# Patient Record
Sex: Female | Born: 1995 | Hispanic: Yes | Marital: Single | Smoking: Never smoker
Health system: Southern US, Community
[De-identification: ages and names within clinical notes are randomized; demographics above are authoritative.]

---
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
## 2016-07-05 ENCOUNTER — Encounter: Payer: Self-pay | Admitting: Unknown Physician Specialty

## 2016-07-05 ENCOUNTER — Ambulatory Visit (INDEPENDENT_AMBULATORY_CARE_PROVIDER_SITE_OTHER): Payer: BLUE CROSS/BLUE SHIELD | Admitting: Unknown Physician Specialty

## 2016-07-05 DIAGNOSIS — J301 Allergic rhinitis due to pollen: Secondary | ICD-10-CM | POA: Diagnosis not present

## 2016-07-05 DIAGNOSIS — Z7689 Persons encountering health services in other specified circumstances: Secondary | ICD-10-CM | POA: Insufficient documentation

## 2016-07-05 DIAGNOSIS — J309 Allergic rhinitis, unspecified: Secondary | ICD-10-CM | POA: Insufficient documentation

## 2016-07-05 DIAGNOSIS — L509 Urticaria, unspecified: Secondary | ICD-10-CM

## 2016-07-05 MED ORDER — FLUTICASONE PROPIONATE 0.05 % EX CREA: TOPICAL_CREAM | Freq: Two times a day (BID) | CUTANEOUS | 0 refills | Status: DC

## 2016-07-05 MED ORDER — MOMETASONE FUROATE 0.1 % EX CREA: 1.0000 "application " | TOPICAL_CREAM | Freq: Every day | CUTANEOUS | 0 refills | Status: DC

## 2016-07-05 NOTE — Assessment & Plan Note (Signed)
Clariton on Zyrtec OTC

## 2016-07-05 NOTE — Progress Notes (Signed)
BP 102/65   Pulse 70   Temp 98.7 F (37.1 C)   Ht 5' 2.2" (1.58 m)   Wt 103 lb 12.8 oz (47.1 kg)   LMP 06/12/2016 (Approximate)   SpO2 99%   BMI 18.86 kg/m    Subjective:    Patient ID: Tammy Shaw, female    DOB: 06/09/1995, 21 y.o.   MRN: [id number]  HPI: Tammy Shaw is a 21 y.o. female  Chief Complaint  Patient presents with  . Establish Care  . Rash    pt states that she has a rash on her arms and neck that come and go. She states that it was very bad on Monday    Depression screen PHQ 2/9 07/05/2016  Decreased Interest 0  Down, Depressed, Hopeless 0  PHQ - 2 Score 0  Altered sleeping 0  Tired, decreased energy 0  Change in appetite 0  Feeling bad or failure about yourself  0  Trouble concentrating 0  Moving slowly or fidgety/restless 0  Suicidal thoughts 0  PHQ-9 Score 0   Rash Pt states she has rashes for the last 2 weeks that comes and goes on upper arms, neck and sometimes around the eye.  The rash itches and lasts for about 2 days and then approves.  Worse in the morning.  Not sure what makes it better.   She states she sneezes "all the time."  States she does have spring allergies   Relevant past medical, surgical, family and social history reviewed and updated as indicated. Interim medical history since our last visit reviewed. Allergies and medications reviewed and updated.  Review of Systems  Constitutional: Negative.   HENT: Negative.   Eyes: Negative.   Respiratory: Negative.   Cardiovascular: Negative.   Gastrointestinal: Negative.   Endocrine: Negative.   Genitourinary: Negative.   Musculoskeletal: Negative.   Skin: Negative.   Allergic/Immunologic: Negative.   Neurological: Negative.   Hematological: Negative.   Psychiatric/Behavioral: Negative.     Per HPI unless specifically indicated above     Objective:    BP 102/65   Pulse 70   Temp 98.7 F (37.1 C)   Ht 5' 2.2" (1.58 m)   Wt 103 lb 12.8 oz (47.1 kg)    LMP 06/12/2016 (Approximate)   SpO2 99%   BMI 18.86 kg/m   Wt Readings from Last 3 Encounters:  07/05/16 103 lb 12.8 oz (47.1 kg) (7 %, Z= -1.48)*   * Growth percentiles are based on CDC 2-20 Years data.    Physical Exam  Constitutional: She is oriented to person, place, and time. She appears well-developed and well-nourished. No distress.  HENT:  Head: Normocephalic and atraumatic.  Eyes: Conjunctivae and lids are normal. Right eye exhibits no discharge. Left eye exhibits no discharge. No scleral icterus.  Neck: Normal range of motion. Neck supple. No JVD present. Carotid bruit is not present.  Cardiovascular: Normal rate, regular rhythm and normal heart sounds.   Pulmonary/Chest: Effort normal and breath sounds normal.  Abdominal: Normal appearance. There is no splenomegaly or hepatomegaly.  Musculoskeletal: Normal range of motion.  Neurological: She is alert and oriented to person, place, and time.  Skin: Skin is warm, dry and intact. No pallor.  Faint rash upper right arm.  Appears to be hives.    Psychiatric: She has a normal mood and affect. Her behavior is normal. Judgment and thought content normal.    No results found for this or any previous visit.  Assessment & Plan:   Problem List Items Addressed This Visit      Unprioritized   Allergic rhinitis    Clariton on Zyrtec OTC      Encounter to establish care with new doctor   Urticaria    Clariton or Zyrtec OTC.  Elocon cream          Follow up plan: Return for physical.

## 2016-07-05 NOTE — Addendum Note (Signed)
Addended by: Gabriel CirriWICKER, Bonny Vanleeuwen on: 07/05/2016 04:49 PM   Modules accepted: Orders

## 2016-07-05 NOTE — Patient Instructions (Signed)
Clariton or Zyrtec OTC for allergies and rash

## 2016-07-05 NOTE — Assessment & Plan Note (Addendum)
Clariton or Zyrtec OTC.  Elocon cream

## 2016-07-06 ENCOUNTER — Telehealth: Payer: Self-pay

## 2016-07-06 NOTE — Telephone Encounter (Signed)
Tried calling patient to talk with her about the cream [place]. We received a fax from the pharmacy stating that the mometasone cream was not covered by Medicaid. I looked up the [facility]Medicaid Formulary and it says that mometasone and fluticasone are covered. I called the pharmacy and they stated that they have tried to run both of the medications as brand and generic and neither are going through. The fax states that there is something [facility]. [facility] so I could call Medicaid. When I pulled up the patient's insurance card before calling and the card we have on file from yesterday is BCBS. Tried calling patient to ask her to take her current insurance card to the pharmacy to see if her medication would be covered then. I called the home number and [facility]. Tried the mobile number and there was no answer and VM box was full so I was unable to leave a message. Will try to call again later.

## 2016-07-06 NOTE — Telephone Encounter (Signed)
Opened in Error.

## 2016-07-10 NOTE — Telephone Encounter (Signed)
Tried calling patient, no answer and VM box was full so I was unable to leave a message. Will try to call again later.  

## 2016-07-11 NOTE — Telephone Encounter (Signed)
Called pharmacy to see if medication had been picked up since I have been unable to get a hold of the patient. Pharmacy stated that the cream was picked up already.

## 2016-07-25 ENCOUNTER — Encounter: Payer: Self-pay | Admitting: Unknown Physician Specialty

## 2016-07-25 ENCOUNTER — Ambulatory Visit (INDEPENDENT_AMBULATORY_CARE_PROVIDER_SITE_OTHER): Payer: BLUE CROSS/BLUE SHIELD | Admitting: Unknown Physician Specialty

## 2016-07-25 VITALS — BP 100/64 | HR 87 | Temp 98.9°F | Ht 62.2 in | Wt 102.4 lb

## 2016-07-25 DIAGNOSIS — Z114 Encounter for screening for human immunodeficiency virus [HIV]: Secondary | ICD-10-CM | POA: Diagnosis not present

## 2016-07-25 DIAGNOSIS — Z0000 Encounter for general adult medical examination without abnormal findings: Secondary | ICD-10-CM

## 2016-07-25 NOTE — Patient Instructions (Addendum)
Cuidados preventivos en las mujeres de entre 52 y 44aos (Preventive Care 18-39 Years, Female) Los cuidados preventivos hacen referencia a las opciones en cuanto al estilo de vida y a las visitas al mdico, las cuales pueden promover la salud y Musician. QU INCLUYEN LOS CUIDADOS PREVENTIVOS?  Un examen fsico anual. Tambin se lo conoce como control de rutina anual.  Exmenes dentales una o dos veces al ao.  Exmenes oculares de [place]. Pregntele al mdico con qu frecuencia debe hacerse controles oculares.  Opciones personales en cuanto al estilo de vida, entre ellas: ? [place] y las piezas dentales. ? Realizar actividad fsica con regularidad. ? Consumir una dieta saludable. ? Evitar el consumo de tabaco y de drogas. ? Limitar el consumo de bebidas alcohlicas. ? Counsellor. ? Tomar los suplementos vitamnicos y [employer] se lo haya indicado el mdico.  QU SUCEDE DURANTE UN CONTROL DE [place]? Los servicios y las pruebas de deteccin que el mdico realiza durante un control de rutina anual dependern de la edad, el estado de salud general, los factores de riesgo relacionados con el estilo de vida y los antecedentes familiares de enfermedades. Psicoterapia El mdico puede hacerle preguntas sobre lo siguiente:  Consumo de alcohol.  Consumo de tabaco.  Consumo de drogas.  [place] familiar y en las relaciones.  Actividad sexual.  Hbitos de alimentacin.  Trabajo y entorno laboral.  Mtodos anticonceptivos.  El ciclo menstrual.  Antecedentes de embarazos. Pruebas de deteccin Pueden hacerle las siguientes pruebas o mediciones:  Estatura, peso e ndice de masa corporal [facility]).  Pruebas de deteccin de la diabetes. Para realizarlas, se controla el nivel de azcar en la sangre (glucemia) despus de que haya pasado un rato sin comer (ayuno).  La presin arterial.  Niveles de lpidos y  colesterol. Se pueden controlar cada 86aos, a partir de los [place].  Controles de la piel.  Anlisis de sangre de hepatitisC.  Anlisis de sangre de hepatitisB.  Pruebas de deteccin de enfermedades de transmisin sexual (ETS).  Pruebas de deteccin de tumores malignos relacionados con el BRCA. Se pueden realizar si tiene antecedentes familiares de cncer de mama, de ovarios, de trompas o de peritoneo.  Examen plvico y prueba de Papanicolaou. Se pueden realizar cada 3aos, a partir de los 21aos. A partir de los 30aos, se pueden realizar cada 5aos si le hacen una prueba de Papanicolaou en combinacin con un anlisis del virus del Engineer, technical sales (VPH). Hable con el mdico [employer], las opciones de tratamiento y, si es necesario, la necesidad de Optometrist ms [place]. Vacunas El mdico puede recomendarle que se aplique algunas vacunas, por ejemplo:  [place] antigripal. Se recomienda su aplicacin todos los aos.  Vacuna contra la difteria, el ttanos y Research officer, trade union (Tdap, Td). Puede que tenga que aplicarse un refuerzo contra el ttanos y la difteria (Td) cada 10aos.  Vacuna contra la varicela. Es posible que tenga que aplicrsela si no recibi esta vacuna.  [place] el VPH. Si es menor de 26aos, puede que necesite aplicarse tres dosis a lo largo de 59mses.  Vacuna contra el sarampin, la rubola y las paperas ([place]. Tendr que aplicarse por lo menos una dosis de la SRP. Podra tambin necesitar una segunda dosis.  Vacuna antineumoccica conjugada 13valente (PCV13). Puede necesitar esta vacuna si tiene determinadas enfermedades y no se vacun anteriormente.  Vacuna antineumoccica de polisacridos (PPSV23). Quizs tenga que aplicarse [place]  dosis si fuma o si sufre determinadas enfermedades.  Vacuna antimeningoccica. Se recomienda la aplicacin de Ardelia Mems dosis si tiene entre 19 y 21aos, y es estudiante universitaria de Psychologist, educational  ao que vive en una residencia estudiantil, o si tiene una de varias enfermedades. Podra tambin necesitar dosis de refuerzo.  Vacuna contra la hepatitis A. Es posible que tenga que aplicarse esta vacuna si tiene determinadas enfermedades, o si trabaja en lugares donde puede estar expuesta a la hepatitisA o viaja a estas zonas.  Vacuna contra la hepatitis B. Es posible que tenga que aplicarse esta vacuna si tiene determinadas enfermedades, o si trabaja en lugares donde puede estar expuesta a la hepatitisB o viaja a estas zonas.  Vacuna antihaemophilus influenzae tipoB (Hib). Tal vez deba aplicarse esta vacuna si tiene determinados factores de [place]. Hable con el [place] deteccin y las vacunas que tiene que [place], y con qu frecuencia debe hacerlo. Esta informacin no tiene Marine scientist el consejo del mdico. Asegrese de hacerle al mdico cualquier pregunta que tenga. Document Released: 10/05/2004 Document Revised: 01/16/2014 Document Reviewed: 10/27/2014 Elsevier Interactive Patient Education  2017 [employer].

## 2016-07-25 NOTE — Progress Notes (Signed)
BP 100/64   Pulse 87   Temp 98.9 F (37.2 C)   Ht 5' 2.2" (1.58 m)   Wt 102 lb 6.4 oz (46.4 kg)   LMP 07/11/2016 (Approximate)   SpO2 97%   BMI 18.61 kg/m    Subjective:    Patient ID: Tammy Shaw, female    DOB: 05/12/1995, 21 y.o.   MRN: [id number]  HPI: Tammy Shaw is a 21 y.o. female  Chief Complaint  Patient presents with  . Annual Exam    pt states she would like to be screened for HIV, order entered    Social History   Social History  . Marital status: Single    Spouse name: N/A  . Number of children: N/A  . Years of education: N/A   Occupational History  . Not on file.   Social History Main Topics  . Smoking status: Never Smoker  . Smokeless tobacco: Never Used  . Alcohol use No  . Drug use: No  . Sexual activity: Yes   Other Topics Concern  . Not on file   Social History Narrative  . No narrative on file   Family History  Problem Relation Age of Onset  . Epilepsy Paternal Grandmother    History reviewed. No pertinent past medical history.  History reviewed. No pertinent surgical history.  Relevant past medical, surgical, family and social history reviewed and updated as indicated. Interim medical history since our last visit reviewed. Allergies and medications reviewed and updated.  Review of Systems  Constitutional: Negative.   HENT: Negative.   Eyes: Negative.   Respiratory: Negative.   Cardiovascular: Negative.   Gastrointestinal: Negative.   Endocrine: Negative.   Genitourinary: Negative.   Musculoskeletal: Negative.   Skin: Negative.   Allergic/Immunologic: Negative.   Neurological: Negative.   Hematological: Negative.   Psychiatric/Behavioral: Negative.     Per HPI unless specifically indicated above     Objective:    BP 100/64   Pulse 87   Temp 98.9 F (37.2 C)   Ht 5' 2.2" (1.58 m)   Wt 102 lb 6.4 oz (46.4 kg)   LMP 07/11/2016 (Approximate)   SpO2 97%   BMI 18.61 kg/m   Wt Readings from Last 3  Encounters:  07/25/16 102 lb 6.4 oz (46.4 kg) (5 %, Z= -1.60)*  07/05/16 103 lb 12.8 oz (47.1 kg) (7 %, Z= -1.48)*   * Growth percentiles are based on CDC 2-20 Years data.    Physical Exam  Constitutional: She is oriented to person, place, and time. She appears well-developed and well-nourished.  HENT:  Head: Normocephalic and atraumatic.  Eyes: Pupils are equal, round, and reactive to light. Right eye exhibits no discharge. Left eye exhibits no discharge. No scleral icterus.  Neck: Normal range of motion. Neck supple. Carotid bruit is not present. No thyromegaly present.  Cardiovascular: Normal rate, regular rhythm and normal heart sounds.  Exam reveals no gallop and no friction rub.   No murmur heard. Pulmonary/Chest: Effort normal and breath sounds normal. No respiratory distress. She has no wheezes. She has no rales.  Abdominal: Soft. Bowel sounds are normal. There is no tenderness. There is no rebound.  Genitourinary: No breast swelling, tenderness or discharge.  Musculoskeletal: Normal range of motion.  Lymphadenopathy:    She has no cervical adenopathy.  Neurological: She is alert and oriented to person, place, and time.  Skin: Skin is warm, dry and intact. No rash noted.  Psychiatric: She has a normal  mood and affect. Her speech is normal and behavior is normal. Judgment and thought content normal. Cognition and memory are normal.    No results found for this or any previous visit.    Assessment & Plan:   Problem List Items Addressed This Visit    None    Visit Diagnoses    Screening for HIV without presence of risk factors    -  Primary   Relevant Orders   HIV antibody   Routine general medical examination at a health care facility       Relevant Orders   GC/Chlamydia Probe Amp   CBC with Differential/Platelet   Comprehensive metabolic panel   TSH       Follow up plan: Return in about 1 year (around 07/25/2017).

## 2016-07-26 LAB — COMPREHENSIVE METABOLIC PANEL
A/G RATIO: 2 (ref 1.2–2.2)
ALBUMIN: 4.9 g/dL (ref 3.5–5.5)
ALT: 11 IU/L (ref 0–32)
AST: 16 IU/L (ref 0–40)
Alkaline Phosphatase: 67 IU/L (ref 39–117)
BUN/Creatinine Ratio: 25 — ABNORMAL HIGH (ref 9–23)
BUN: 15 mg/dL (ref 6–20)
Bilirubin Total: 0.4 mg/dL (ref 0.0–1.2)
CALCIUM: 10 mg/dL (ref 8.7–10.2)
CO2: 22 mmol/L (ref 20–29)
CREATININE: 0.6 mg/dL (ref 0.57–1.00)
Chloride: 103 mmol/L (ref 96–106)
GFR, EST AFRICAN AMERICAN: 153 mL/min/{1.73_m2} (ref >59)
GFR, EST NON AFRICAN AMERICAN: 133 mL/min/{1.73_m2} (ref >59)
GLOBULIN, TOTAL: 2.5 g/dL (ref 1.5–4.5)
Glucose: 80 mg/dL (ref 65–99)
POTASSIUM: 4.2 mmol/L (ref 3.5–5.2)
SODIUM: 142 mmol/L (ref 134–144)
TOTAL PROTEIN: 7.4 g/dL (ref 6.0–8.5)

## 2016-07-26 LAB — CBC WITH DIFFERENTIAL/PLATELET
Basophils Absolute: 0 10*3/uL (ref 0.0–0.2)
Basos: 1 %
EOS (ABSOLUTE): 0.1 10*3/uL (ref 0.0–0.4)
EOS: 2 %
HEMATOCRIT: 42 % (ref 34.0–46.6)
HEMOGLOBIN: 13.4 g/dL (ref 11.1–15.9)
IMMATURE GRANS (ABS): 0 10*3/uL (ref 0.0–0.1)
IMMATURE GRANULOCYTES: 0 %
Lymphocytes Absolute: 1.3 10*3/uL (ref 0.7–3.1)
Lymphs: 24 %
MCH: 27.9 pg (ref 26.6–33.0)
MCHC: 31.9 g/dL (ref 31.5–35.7)
MCV: 87 fL (ref 79–97)
MONOCYTES: 6 %
Monocytes Absolute: 0.3 10*3/uL (ref 0.1–0.9)
NEUTROS PCT: 67 %
Neutrophils Absolute: 3.8 10*3/uL (ref 1.4–7.0)
Platelets: 261 10*3/uL (ref 150–379)
RBC: 4.81 x10E6/uL (ref 3.77–5.28)
RDW: 13.5 % (ref 12.3–15.4)
WBC: 5.5 10*3/uL (ref 3.4–10.8)

## 2016-07-26 LAB — TSH: TSH: 0.254 u[IU]/mL — ABNORMAL LOW (ref 0.450–4.500)

## 2016-07-26 LAB — HIV ANTIBODY (ROUTINE TESTING W REFLEX): HIV Screen 4th Generation wRfx: NONREACTIVE

## 2016-07-28 ENCOUNTER — Other Ambulatory Visit: Payer: Self-pay | Admitting: Unknown Physician Specialty

## 2016-07-28 ENCOUNTER — Encounter: Payer: Self-pay | Admitting: Unknown Physician Specialty

## 2016-07-28 DIAGNOSIS — R7989 Other specified abnormal findings of blood chemistry: Secondary | ICD-10-CM

## 2016-07-28 LAB — GC/CHLAMYDIA PROBE AMP
CHLAMYDIA, DNA PROBE: NEGATIVE
Neisseria gonorrhoeae by PCR: NEGATIVE

## 2016-08-01 ENCOUNTER — Encounter: Payer: Self-pay | Admitting: Unknown Physician Specialty

## 2016-12-27 ENCOUNTER — Other Ambulatory Visit: Payer: BLUE CROSS/BLUE SHIELD

## 2016-12-27 DIAGNOSIS — R7989 Other specified abnormal findings of blood chemistry: Secondary | ICD-10-CM

## 2016-12-28 LAB — THYROID PANEL WITH TSH
FREE THYROXINE INDEX: 1.7 (ref 1.2–4.9)
T3 Uptake Ratio: 26 % (ref 24–39)
T4 TOTAL: 6.7 ug/dL (ref 4.5–12.0)
TSH: 2.36 u[IU]/mL (ref 0.450–4.500)

## 2016-12-29 ENCOUNTER — Encounter: Payer: Self-pay | Admitting: Unknown Physician Specialty

## 2017-07-25 LAB — CHLAMYDIA SCREEN

## 2017-08-06 ENCOUNTER — Ambulatory Visit (INDEPENDENT_AMBULATORY_CARE_PROVIDER_SITE_OTHER): Payer: 59 | Admitting: Physician Assistant

## 2017-08-06 ENCOUNTER — Encounter: Payer: Self-pay | Admitting: Physician Assistant

## 2017-08-06 VITALS — BP 84/52 | HR 73 | Temp 98.5°F | Ht 62.38 in | Wt 105.0 lb

## 2017-08-06 DIAGNOSIS — Z0000 Encounter for general adult medical examination without abnormal findings: Secondary | ICD-10-CM

## 2017-08-06 DIAGNOSIS — Z0001 Encounter for general adult medical examination with abnormal findings: Secondary | ICD-10-CM

## 2017-08-06 DIAGNOSIS — N921 Excessive and frequent menstruation with irregular cycle: Secondary | ICD-10-CM | POA: Diagnosis not present

## 2017-08-06 MED ORDER — NORETHINDRONE ACET-ETHINYL EST 1.5-30 MG-MCG PO TABS: 1.0000 | ORAL_TABLET | Freq: Every day | ORAL | 3 refills | Status: DC

## 2017-08-06 NOTE — Progress Notes (Signed)
Subjective:    Patient ID: Tammy Shaw, female    DOB: 06/19/1995, 22 y.o.   MRN: [id number]  Tammy Shaw is a 22 y.o. 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 Relationship - no Sexually active - not currently Sexually active ever - yes LMP - first day was 07/25/2017 Reports heavy menstrual cycles with some irregular bleeding, though the irregular bleeding can be intermittent. Does not smoke, does not have migraines, has never had blood clot.    History reviewed. No pertinent past medical history. History reviewed. No pertinent surgical history. Social History   Socioeconomic History  . Marital status: Single    Spouse name: Not on file  . Number of children: Not on file  . Years of education: Not on file  . Highest education level: Not on file  Occupational History  . Not on file  Social Needs  . Financial resource strain: Not on file  . Food insecurity:    Worry: Not on file    Inability: Not on file  . Transportation needs:    Medical: Not on file    Non-medical: Not on file  Tobacco Use  . Smoking status: Never Smoker  . Smokeless tobacco: Never Used  Substance and Sexual Activity  . Alcohol use: No  . Drug use: No  . Sexual activity: Yes  Lifestyle  . Physical activity:    Days per week: Not on file    Minutes per session: Not on file  . Stress: Not on file  Relationships  . Social connections:    Talks on phone: Not on file    Gets together: Not on file    Attends religious service: Not on file    Active member of club or organization: Not on file    Attends meetings of clubs or organizations: Not on file    Relationship status: Not on file  . Intimate partner violence:    Fear of current or ex partner: Not on file    Emotionally abused: Not on file    Physically abused: Not on file    Forced sexual activity:  Not on file  Other Topics Concern  . Not on file  Social History Narrative  . Not on file   Family History  Problem Relation Age of Onset  . Epilepsy Paternal Grandmother    No current outpatient medications on file prior to visit.   No current facility-administered medications on file prior to visit.     Review of Systems Per HPI unless specifically indicated above     Objective:    BP (!) 84/52 (BP Location: Left Arm, Patient Position: Sitting, Cuff Size: Normal)   Pulse 73   Temp 98.5 F (36.9 C) (Tympanic)   Ht 5' 2.38" (1.584 m)   Wt 105 lb (47.6 kg)   SpO2 100%   BMI 18.97 kg/m   Wt Readings from Last 3 Encounters:  08/06/17 105 lb (47.6 kg)  07/25/16 102 lb 6.4 oz (46.4 kg) (5 %, Z= -1.60)*  07/05/16 103 lb 12.8 oz (47.1 kg) (7 %, Z= -1.48)*   * Growth percentiles are based on CDC (Girls, 2-20 Years) data.    Physical Exam  Constitutional: She is oriented to person, place, and time. She appears well-developed and well-nourished.  Cardiovascular: Normal rate and regular rhythm.  Pulmonary/Chest: Effort normal and breath sounds  normal.  Neurological: She is alert and oriented to person, place, and time.  Skin: Skin is warm and dry.  Psychiatric: She has a normal mood and affect. Her behavior is normal.   Results for orders placed or performed in visit on 08/06/17  Chlamydia screen  Result Value Ref Range   Chlamydia Probe Amp        Assessment & Plan:   1. Annual physical exam  UTD on vaccinations and STI screening. She will be due for PAP when she is 22. Can start OCP as below for menstrual cycle regulation, counseled on start methods, contraceptive use, and how it does not protect against STI. She can follow up after she turns 22 for PAP and to assess effectiveness of OCP.  2. Menorrhagia with irregular cycle  - Norethindrone Acetate-Ethinyl Estradiol (JUNEL 1.5/30) 1.5-30 MG-MCG tablet; Take 1 tablet by mouth daily.  Dispense: 3 Package; Refill:  3    Follow up plan: Return in about 5 months (around 01/06/2018) for PAP, follow up irregular cycle .  Osvaldo Angst, PA-C [facility] 08/07/2017, 11:10 AM

## 2017-08-06 NOTE — Patient Instructions (Signed)
Menorrhagia Menorrhagia is when your menstrual periods are heavy or last longer than usual. Follow these instructions at home:  Only take medicine as told by your doctor.  Take any iron pills as told by your doctor. Heavy bleeding may cause low levels of iron in your body.  Do not take aspirin 1 week before or during your period. Aspirin can make the bleeding worse.  Lie down for a while if you change your tampon or pad more than once in 2 hours. This may help lessen the bleeding.  Eat a healthy diet and foods with iron. These foods include leafy green vegetables, meat, liver, eggs, and whole grain breads and cereals.  Do not try to lose weight. Wait until the heavy bleeding has stopped and your iron level is normal. Contact a doctor if:  You soak through a pad or tampon every 1 or 2 hours, and this happens every time you have a period.  You need to use pads and tampons at the same time because you are bleeding so much.  You need to change your pad or tampon during the night.  You have a period that lasts for more than 8 days.  You pass clots bigger than 1 inch (2.5 cm) wide.  You have irregular periods that happen more or less often than once a month.  You feel dizzy or pass out (faint).  You feel very weak or tired.  You feel short of breath or feel your heart is beating too fast when you exercise.  You feel sick to your stomach (nausea) and you throw up (vomit) while you are taking your medicine.  You have watery poop (diarrhea) while you are taking your medicine.  You have any problems that may be related to the medicine you are taking. Get help right away if:  You soak through 4 or more pads or tampons in 2 hours.  You have any bleeding while you are pregnant. This information is not intended to replace advice given to you by your health care provider. Make sure you discuss any questions you have with your health care provider. Document Released: 10/05/2007 Document  Revised: 06/03/2015 Document Reviewed: 06/27/2012 Elsevier Interactive Patient Education  2017 Elsevier Inc.  

## 2018-01-07 ENCOUNTER — Ambulatory Visit: Payer: 59 | Admitting: Family Medicine

## 2018-06-05 ENCOUNTER — Ambulatory Visit: Payer: Self-pay | Admitting: Unknown Physician Specialty

## 2018-06-05 NOTE — Telephone Encounter (Addendum)
Pt. Reports she was exposed to someone who tested positive for COVID 19. No symptoms.Warm transfer to [facility] in the practice. Answer Assessment - Initial Assessment Questions 1. CLOSE CONTACT: "Who is the person with the confirmed or suspected COVID-19 infection that you were exposed to?"     Tested positive positive 2. PLACE of CONTACT: "Where were you when you were exposed to COVID-19?" (e.g., home, school, medical waiting room; which city?)     Home 3. TYPE of CONTACT: "How much contact was there?" (e.g., sitting next to, live in same house, work in same office, same building)     Several hours 4. DURATION of CONTACT: "How long were you in contact with the COVID-19 patient?" (e.g., a few seconds, passed by person, a few minutes, live with the patient)     Over night 5. DATE of CONTACT: "When did you have contact with a COVID-19 patient?" (e.g., how many days ago)     10 days 6. TRAVEL: "Have you traveled out of the country recently?" If so, "When and where?"     * Also ask about out-of-state travel, since the CDC has identified some high risk cities for community spread in the [place].     * Note: Travel becomes less relevant if there is widespread community transmission where the patient lives.     No 7. COMMUNITY SPREAD: "Are there lots of cases of COVID-19 (community spread) where you live?" (See public health department website, if unsure)   * MAJOR community spread: high number of cases; numbers of cases are increasing; many people hospitalized.   * MINOR community spread: low number of cases; not increasing; few or no people hospitalized     Yes 8. SYMPTOMS: "Do you have any symptoms?" (e.g., fever, cough, breathing difficulty)     No 9. PREGNANCY OR POSTPARTUM: "Is there any chance you are pregnant?" "When was your last menstrual period?" "Did you deliver in the last 2 weeks?"     No 10. HIGH RISK: "Do you have any heart or lung problems? Do you have a weak immune system?" (e.g., CHF,  COPD, asthma, HIV positive, chemotherapy, renal failure, diabetes mellitus, sickle cell anemia)       No  Protocols used: CORONAVIRUS (COVID-19) EXPOSURE-A-AH

## 2018-06-06 ENCOUNTER — Other Ambulatory Visit: Payer: Self-pay

## 2018-06-06 ENCOUNTER — Encounter: Payer: Self-pay | Admitting: Family Medicine

## 2018-06-06 ENCOUNTER — Ambulatory Visit (INDEPENDENT_AMBULATORY_CARE_PROVIDER_SITE_OTHER): Payer: 59 | Admitting: Family Medicine

## 2018-06-06 DIAGNOSIS — Z20828 Contact with and (suspected) exposure to other viral communicable diseases: Secondary | ICD-10-CM

## 2018-06-06 DIAGNOSIS — Z20822 Contact with and (suspected) exposure to covid-19: Secondary | ICD-10-CM

## 2018-06-06 NOTE — Telephone Encounter (Signed)
This patient has her preferred language as Spanish- do we know how I can get an interpreter? It may be better to transfer this person to the one in the office so they can use the translator on a stick through the video call

## 2018-06-06 NOTE — Telephone Encounter (Signed)
Called pt no answer, unable to lvm

## 2018-06-06 NOTE — Progress Notes (Signed)
There were no vitals taken for this visit.   Subjective:    Patient ID: Tammy Shaw, female    DOB: 08/24/1995, 22 y.o.   MRN: [id number]  HPI: Tammy Shaw is a 23 y.o. female  Chief Complaint  Patient presents with  . covid exposure    Patient was exposed to someone that tested positive last week, she was with them 05/19/18   [name] presents today for exposure to COVID. She went to a birthday party on 05/19/18. She notes that she is feeling fine. She has no fevers. No chills. No SOB. No diarrhea. She has not been around this person for the past 18 days. She is otherwise feeling well with no other concerns or complaints at this time.   Relevant past medical, surgical, family and social history reviewed and updated as indicated. Interim medical history since our last visit reviewed. Allergies and medications reviewed and updated.  Review of Systems  Constitutional: Negative.   Respiratory: Negative.   Cardiovascular: Negative.   Gastrointestinal: Negative.   Neurological: Negative.   Psychiatric/Behavioral: Negative.     Per HPI unless specifically indicated above     Objective:    There were no vitals taken for this visit.  Wt Readings from Last 3 Encounters:  08/06/17 105 lb (47.6 kg)  07/25/16 102 lb 6.4 oz (46.4 kg) (5 %, Z= -1.60)*  07/05/16 103 lb 12.8 oz (47.1 kg) (7 %, Z= -1.48)*   * Growth percentiles are based on CDC (Girls, 2-20 Years) data.    Physical Exam Vitals signs and nursing note reviewed.  Constitutional:      General: She is not in acute distress.    Appearance: Normal appearance. She is not ill-appearing, toxic-appearing or diaphoretic.  HENT:     Head: Normocephalic and atraumatic.     Right Ear: External ear normal.     Left Ear: External ear normal.     Nose: Nose normal.     Mouth/Throat:     Mouth: Mucous membranes are moist.     Pharynx: Oropharynx is clear.  Eyes:     General: No scleral icterus.       Right eye:  No discharge.        Left eye: No discharge.     Conjunctiva/sclera: Conjunctivae normal.     Pupils: Pupils are equal, round, and reactive to light.  Neck:     Musculoskeletal: Normal range of motion.  Pulmonary:     Effort: Pulmonary effort is normal. No respiratory distress.     Comments: Speaking in full sentences Musculoskeletal: Normal range of motion.  Skin:    Coloration: Skin is not jaundiced or pale.     Findings: No bruising, erythema, lesion or rash.  Neurological:     Mental Status: She is alert and oriented to person, place, and time. Mental status is at baseline.  Psychiatric:        Mood and Affect: Mood normal.        Behavior: Behavior normal.        Thought Content: Thought content normal.        Judgment: Judgment normal.     Results for orders placed or performed in visit on 08/06/17  Chlamydia screen  Result Value Ref Range   Chlamydia Probe Amp        Assessment & Plan:   Problem List Items Addressed This Visit    None    Visit Diagnoses    Close Exposure  to Covid-19 Virus    -  Primary   Exposure 18 days ago. Entirely asymptomatic. No more need to self-quarantine. No need for monitoring. Call with any concerns.        Follow up plan: Return if symptoms worsen or fail to improve.    . This visit was completed via FaceTime due to the restrictions of the COVID-19 pandemic. All issues as above were discussed and addressed. Physical exam was done as above through visual confirmation on FaceTIme. If it was felt that the patient should be evaluated in the office, they were directed there. The patient verbally consented to this visit. . Location of the patient: home . Location of the provider: home . Those involved with this call:  . Provider: Olevia PerchesMegan , DO . CMA: Tiffany Reel, CMA . Front Desk/Registration: Adela Portshristan Williamson  . Time spent on call: 15 minutes with patient face to face via video conference. More than 50% of this time was spent  in counseling and coordination of care. 23 minutes total spent in review of patient's record and preparation of their chart.

## 2018-08-15 ENCOUNTER — Encounter: Payer: Self-pay | Admitting: Family Medicine

## 2018-08-15 ENCOUNTER — Ambulatory Visit (INDEPENDENT_AMBULATORY_CARE_PROVIDER_SITE_OTHER): Payer: 59 | Admitting: Family Medicine

## 2018-08-15 ENCOUNTER — Other Ambulatory Visit: Payer: Self-pay

## 2018-08-15 VITALS — BP 110/68 | HR 68 | Temp 98.4°F | Ht 62.0 in | Wt 114.0 lb

## 2018-08-15 DIAGNOSIS — Z0000 Encounter for general adult medical examination without abnormal findings: Secondary | ICD-10-CM | POA: Diagnosis not present

## 2018-08-15 NOTE — Progress Notes (Signed)
BP 110/68   Pulse 68   Temp 98.4 F (36.9 C) (Oral)   Ht 5\' 2"  (1.575 m)   Wt 114 lb (51.7 kg)   SpO2 97%   BMI 20.85 kg/m    Subjective:    Patient ID: Tammy Shaw, female    DOB: 04/16/1995, 23 y.o.   MRN: [id number]  HPI: Tammy Shaw is a 23 y.o. female presenting on 08/15/2018 for comprehensive medical examination. Current medical complaints include:none  Menopausal Symptoms: no  Depression Screen done today and results listed below:  [facility] 08/15/2018 08/07/2017 07/05/2016  Decreased Interest 0 0 0  Down, Depressed, Hopeless 0 0 0  PHQ - 2 Score 0 0 0  Altered sleeping 0 0 0  Tired, decreased energy 0 0 0  Change in appetite 0 0 0  Feeling bad or failure about yourself  0 0 0  Trouble concentrating 0 0 0  Moving slowly or fidgety/restless 0 0 0  Suicidal thoughts 0 0 0  PHQ-9 Score 0 0 0  Difficult doing work/chores Not difficult at all - -    Past Medical History:  History reviewed. No pertinent past medical history.  Surgical History:  History reviewed. No pertinent surgical history.  Medications:  Current Outpatient Medications on File Prior to Visit  Medication Sig  . Norethindrone Acetate-Ethinyl Estradiol (JUNEL 1.5/30) 1.5-30 MG-MCG tablet Take 1 tablet by mouth daily.   No current facility-administered medications on file prior to visit.     Allergies:  No Known Allergies  Social History:  Social History   Socioeconomic History  . Marital status: Single    Spouse name: Not on file  . Number of children: Not on file  . Years of education: Not on file  . Highest education level: Not on file  Occupational History  . Not on file  Social Needs  . Financial resource strain: Not on file  . Food insecurity    Worry: Not on file    Inability: Not on file  . Transportation needs    Medical: Not on file    Non-medical: Not on file  Tobacco Use  . Smoking status: Never Smoker  . Smokeless tobacco: Never Used   Substance and Sexual Activity  . Alcohol use: No  . Drug use: No  . Sexual activity: Yes  Lifestyle  . Physical activity    Days per week: Not on file    Minutes per session: Not on file  . Stress: Not on file  Relationships  . Social Musicianconnections    Talks on phone: Not on file    Gets together: Not on file    Attends religious service: Not on file    Active member of club or organization: Not on file    Attends meetings of clubs or organizations: Not on file    Relationship status: Not on file  . Intimate partner violence    Fear of current or ex partner: Not on file    Emotionally abused: Not on file    Physically abused: Not on file    Forced sexual activity: Not on file  Other Topics Concern  . Not on file  Social History Narrative  . Not on file   Social History   Tobacco Use  Smoking Status Never Smoker  Smokeless Tobacco Never Used   Social History   Substance and Sexual Activity  Alcohol Use No    Family History:  Family History  Problem  Relation Age of Onset  . Epilepsy Paternal Grandmother     Past medical history, surgical history, medications, allergies, family history and social history reviewed with patient today and changes made to appropriate areas of the chart.   Review of Systems  Constitutional: Negative.   HENT: Negative.   Eyes: Negative.   Respiratory: Negative.   Cardiovascular: Negative.   Gastrointestinal: Negative.   Genitourinary: Negative.   Musculoskeletal: Negative.   Skin: Negative.   Endo/Heme/Allergies: Negative.     All other ROS negative except what is listed above and in the HPI.      Objective:    BP 110/68   Pulse 68   Temp 98.4 F (36.9 C) (Oral)   Ht 5\' 2"  (1.575 m)   Wt 114 lb (51.7 kg)   SpO2 97%   BMI 20.85 kg/m   Wt Readings from Last 3 Encounters:  08/15/18 114 lb (51.7 kg)  08/06/17 105 lb (47.6 kg)  07/25/16 102 lb 6.4 oz (46.4 kg) (5 %, Z= -1.60)*   * Growth percentiles are based on CDC  (Girls, 2-20 Years) data.    Physical Exam Vitals signs and nursing note reviewed.  Constitutional:      General: She is not in acute distress.    Appearance: Normal appearance. She is not ill-appearing, toxic-appearing or diaphoretic.  HENT:     Head: Normocephalic and atraumatic.     Right Ear: Tympanic membrane, ear canal and external ear normal. There is no impacted cerumen.     Left Ear: Tympanic membrane, ear canal and external ear normal. There is no impacted cerumen.     Nose: Nose normal. No congestion or rhinorrhea.     Mouth/Throat:     Mouth: Mucous membranes are moist.     Pharynx: Oropharynx is clear. No oropharyngeal exudate or posterior oropharyngeal erythema.  Eyes:     General: No scleral icterus.       Right eye: No discharge.        Left eye: No discharge.     Extraocular Movements: Extraocular movements intact.     Conjunctiva/sclera: Conjunctivae normal.     Pupils: Pupils are equal, round, and reactive to light.  Neck:     Musculoskeletal: Normal range of motion and neck supple. No neck rigidity or muscular tenderness.     Vascular: No carotid bruit.  Cardiovascular:     Rate and Rhythm: Normal rate and regular rhythm.     Pulses: Normal pulses.     Heart sounds: No murmur. No friction rub. No gallop.   Pulmonary:     Effort: Pulmonary effort is normal. No respiratory distress.     Breath sounds: Normal breath sounds. No stridor. No wheezing, rhonchi or rales.  Chest:     Chest wall: No tenderness.  Abdominal:     General: Abdomen is flat. Bowel sounds are normal. There is no distension.     Palpations: Abdomen is soft. There is no mass.     Tenderness: There is no abdominal tenderness. There is no right CVA tenderness, left CVA tenderness, guarding or rebound.     Hernia: No hernia is present.  Genitourinary:    Comments: Breast and pelvic exams deferred with shared decision making Musculoskeletal:        General: No swelling, tenderness, deformity or  signs of injury.     Right lower leg: No edema.     Left lower leg: No edema.  Lymphadenopathy:     Cervical: No  cervical adenopathy.  Skin:    General: Skin is warm and dry.     Capillary Refill: Capillary refill takes less than 2 seconds.     Coloration: Skin is not jaundiced or pale.     Findings: No bruising, erythema, lesion or rash.  Neurological:     General: No focal deficit present.     Mental Status: She is alert and oriented to person, place, and time. Mental status is at baseline.     Cranial Nerves: No cranial nerve deficit.     Sensory: No sensory deficit.     Motor: No weakness.     Coordination: Coordination normal.     Gait: Gait normal.     Deep Tendon Reflexes: Reflexes normal.  Psychiatric:        Mood and Affect: Mood normal.        Behavior: Behavior normal.        Thought Content: Thought content normal.        Judgment: Judgment normal.     Results for orders placed or performed in visit on 08/15/18  Microscopic Examination   URINE  Result Value Ref Range   WBC, UA 0-5 0 - 5 /hpf   RBC None seen 0 - 2 /hpf   Epithelial Cells (non renal) 0-10 0 - 10 /hpf   Bacteria, UA Few (A) None seen/Few  Urine Culture, Reflex   URINE  Result Value Ref Range   Urine Culture, Routine WILL FOLLOW   UA/M w/rflx Culture, Routine   Specimen: Urine   URINE  Result Value Ref Range   Specific Gravity, UA 1.020 1.005 - 1.030   pH, UA 8.5 (H) 5.0 - 7.5   Color, UA Yellow Yellow   Appearance Ur Clear Clear   Leukocytes,UA Trace (A) Negative   Protein,UA Negative Negative/Trace   Glucose, UA Negative Negative   Ketones, UA Negative Negative   RBC, UA Negative Negative   Bilirubin, UA Negative Negative   Urobilinogen, Ur 0.2 0.2 - 1.0 mg/dL   Nitrite, UA Negative Negative   Microscopic Examination See below:    Urinalysis Reflex Comment       Assessment & Plan:   Problem List Items Addressed This Visit    None    Visit Diagnoses    Routine general medical  examination at a health care facility    -  Primary   Vaccines up to date. Screening labs checked today. Declines pap- will have her return in 6 months for well woman. Call with any concerns.    Relevant Orders   CBC with Differential/Platelet   Comprehensive metabolic panel   Lipid Panel w/o Chol/HDL Ratio   TSH   UA/M w/rflx Culture, Routine (Completed)   GC/Chlamydia Probe Amp   HIV antibody (with reflex)       Follow up plan: Return in about 6 months (around 02/15/2019) for Well woman.   LABORATORY TESTING:  - Pap smear: refused  IMMUNIZATIONS:   - Tdap: Tetanus vaccination status reviewed: last tetanus booster within 10 years. - Influenza: Postponed to flu season - HPV: Up to date  PATIENT COUNSELING:   Advised to take 1 mg of folate supplement per day if capable of pregnancy.   Sexuality: Discussed sexually transmitted diseases, partner selection, use of condoms, avoidance of unintended pregnancy  and contraceptive alternatives.   Advised to avoid cigarette smoking.  I discussed with the patient that most people either abstain from alcohol or drink within safe limits (<=14/week and <=  4 drinks/occasion for males, <=7/weeks and <= 3 drinks/occasion for females) and that the risk for alcohol disorders and other health effects rises proportionally with the number of drinks per week and how often a drinker exceeds daily limits.  Discussed cessation/primary prevention of drug use and availability of treatment for abuse.   Diet: Encouraged to adjust caloric intake to maintain  or achieve ideal body weight, to reduce intake of dietary saturated fat and total fat, to limit sodium intake by avoiding high sodium foods and not adding table salt, and to maintain adequate dietary potassium and calcium preferably from fresh fruits, vegetables, and low-fat dairy products.    stressed the importance of regular exercise  Injury prevention: Discussed safety belts, safety helmets, smoke  detector, smoking near bedding or upholstery.   Dental health: Discussed importance of regular tooth brushing, flossing, and dental visits.    NEXT PREVENTATIVE PHYSICAL DUE IN 1 YEAR. Return in about 6 months (around 02/15/2019) for Well woman.

## 2018-08-15 NOTE — Patient Instructions (Signed)
Health Maintenance, Female Adopting a healthy lifestyle and getting preventive care are important in promoting health and wellness. Ask your health care provider about:  The right schedule for you to have regular tests and exams.  Things you can do on your own to prevent diseases and keep yourself healthy. What should I know about diet, weight, and exercise? Eat a healthy diet   Eat a diet that includes plenty of vegetables, fruits, low-fat dairy products, and lean protein.  Do not eat a lot of foods that are high in solid fats, added sugars, or sodium. Maintain a healthy weight Body mass index (BMI) is used to identify weight problems. It estimates body fat based on height and weight. Your health care provider can help determine your BMI and help you achieve or maintain a healthy weight. Get regular exercise Get regular exercise. This is one of the most important things you can do for your health. Most adults should:  Exercise for at least 150 minutes each week. The exercise should increase your heart rate and make you sweat (moderate-intensity exercise).  Do strengthening exercises at least twice a week. This is in addition to the moderate-intensity exercise.  Spend less time sitting. Even light physical activity can be beneficial. Watch cholesterol and blood lipids Have your blood tested for lipids and cholesterol at 22 years of age, then have this test every 5 years. Have your cholesterol levels checked more often if:  Your lipid or cholesterol levels are high.  You are older than 22 years of age.  You are at high risk for heart disease. What should I know about cancer screening? Depending on your health history and family history, you may need to have cancer screening at various ages. This may include screening for:  Breast cancer.  Cervical cancer.  Colorectal cancer.  Skin cancer.  Lung cancer. What should I know about heart disease, diabetes, and high blood  pressure? Blood pressure and heart disease  High blood pressure causes heart disease and increases the risk of stroke. This is more likely to develop in people who have high blood pressure readings, are of African descent, or are overweight.  Have your blood pressure checked: ? Every 3-5 years if you are 18-39 years of age. ? Every year if you are 40 years old or older. Diabetes Have regular diabetes screenings. This checks your fasting blood sugar level. Have the screening done:  Once every three years after age 40 if you are at a normal weight and have a low risk for diabetes.  More often and at a younger age if you are overweight or have a high risk for diabetes. What should I know about preventing infection? Hepatitis B If you have a higher risk for hepatitis B, you should be screened for this virus. Talk with your health care provider to find out if you are at risk for hepatitis B infection. Hepatitis C Testing is recommended for:  Everyone born from 1945 through 1965.  Anyone with known risk factors for hepatitis C. Sexually transmitted infections (STIs)  Get screened for STIs, including gonorrhea and chlamydia, if: ? You are sexually active and are younger than 22 years of age. ? You are older than 22 years of age and your health care provider tells you that you are at risk for this type of infection. ? Your sexual activity has changed since you were last screened, and you are at increased risk for chlamydia or gonorrhea. Ask your health care provider if   you are at risk.  Ask your health care provider about whether you are at high risk for HIV. Your health care provider may recommend a prescription medicine to help prevent HIV infection. If you choose to take medicine to prevent HIV, you should first get tested for HIV. You should then be tested every 3 months for as long as you are taking the medicine. Pregnancy  If you are about to stop having your period (premenopausal) and  you may become pregnant, seek counseling before you get pregnant.  Take 400 to 800 micrograms (mcg) of folic acid every day if you become pregnant.  Ask for birth control (contraception) if you want to prevent pregnancy. Osteoporosis and menopause Osteoporosis is a disease in which the bones lose minerals and strength with aging. This can result in bone fractures. If you are 65 years old or older, or if you are at risk for osteoporosis and fractures, ask your health care provider if you should:  Be screened for bone loss.  Take a calcium or vitamin D supplement to lower your risk of fractures.  Be given hormone replacement therapy (HRT) to treat symptoms of menopause. Follow these instructions at home: Lifestyle  Do not use any products that contain nicotine or tobacco, such as cigarettes, e-cigarettes, and chewing tobacco. If you need help quitting, ask your health care provider.  Do not use street drugs.  Do not share needles.  Ask your health care provider for help if you need support or information about quitting drugs. Alcohol use  Do not drink alcohol if: ? Your health care provider tells you not to drink. ? You are pregnant, may be pregnant, or are planning to become pregnant.  If you drink alcohol: ? Limit how much you use to 0-1 drink a day. ? Limit intake if you are breastfeeding.  Be aware of how much alcohol is in your drink. In the [place] one drink equals one 12 oz bottle of beer (355 mL), one 5 oz glass of wine (148 mL), or one 1 oz glass of hard liquor (44 mL). General instructions  Schedule regular health, dental, and eye exams.  Stay current with your vaccines.  Tell your health care provider if: ? You often feel depressed. ? You have ever been abused or do not feel safe at home. Summary  Adopting a healthy lifestyle and getting preventive care are important in promoting health and wellness.  Follow your health care provider's instructions about healthy  diet, exercising, and getting tested or screened for diseases.  Follow your health care provider's instructions on monitoring your cholesterol and blood pressure. This information is not intended to replace advice given to you by your health care provider. Make sure you discuss any questions you have with your health care provider. Document Released: 07/11/2010 Document Revised: 12/19/2017 Document Reviewed: 12/19/2017 Elsevier Patient Education  2020 [employer]  

## 2018-08-16 LAB — COMPREHENSIVE METABOLIC PANEL
ALT: 9 IU/L (ref 0–32)
AST: 18 IU/L (ref 0–40)
Albumin/Globulin Ratio: 1.8 (ref 1.2–2.2)
Albumin: 4.8 g/dL (ref 3.9–5.0)
Alkaline Phosphatase: 55 IU/L (ref 39–117)
BUN/Creatinine Ratio: 15 (ref 9–23)
BUN: 10 mg/dL (ref 6–20)
Bilirubin Total: 0.3 mg/dL (ref 0.0–1.2)
CO2: 23 mmol/L (ref 20–29)
Calcium: 9.8 mg/dL (ref 8.7–10.2)
Chloride: 101 mmol/L (ref 96–106)
Creatinine, Ser: 0.68 mg/dL (ref 0.57–1.00)
GFR calc Af Amer: 145 mL/min/{1.73_m2} (ref >59)
GFR calc non Af Amer: 125 mL/min/{1.73_m2} (ref >59)
Globulin, Total: 2.6 g/dL (ref 1.5–4.5)
Glucose: 86 mg/dL (ref 65–99)
Potassium: 4.2 mmol/L (ref 3.5–5.2)
Sodium: 139 mmol/L (ref 134–144)
Total Protein: 7.4 g/dL (ref 6.0–8.5)

## 2018-08-16 LAB — CBC WITH DIFFERENTIAL/PLATELET
Basophils Absolute: 0.1 10*3/uL (ref 0.0–0.2)
Basos: 1 %
EOS (ABSOLUTE): 0.2 10*3/uL (ref 0.0–0.4)
Eos: 4 %
Hematocrit: 40.1 % (ref 34.0–46.6)
Hemoglobin: 13.4 g/dL (ref 11.1–15.9)
Immature Grans (Abs): 0 10*3/uL (ref 0.0–0.1)
Immature Granulocytes: 0 %
Lymphocytes Absolute: 1.4 10*3/uL (ref 0.7–3.1)
Lymphs: 29 %
MCH: 29.7 pg (ref 26.6–33.0)
MCHC: 33.4 g/dL (ref 31.5–35.7)
MCV: 89 fL (ref 79–97)
Monocytes Absolute: 0.2 10*3/uL (ref 0.1–0.9)
Monocytes: 5 %
Neutrophils Absolute: 3 10*3/uL (ref 1.4–7.0)
Neutrophils: 61 %
Platelets: 232 10*3/uL (ref 150–450)
RBC: 4.51 x10E6/uL (ref 3.77–5.28)
RDW: 12 % (ref 11.7–15.4)
WBC: 4.8 10*3/uL (ref 3.4–10.8)

## 2018-08-16 LAB — TSH: TSH: 2.27 u[IU]/mL (ref 0.450–4.500)

## 2018-08-16 LAB — LIPID PANEL W/O CHOL/HDL RATIO
Cholesterol, Total: 177 mg/dL (ref 100–199)
HDL: 51 mg/dL (ref >39)
LDL Calculated: 112 mg/dL — ABNORMAL HIGH (ref 0–99)
Triglycerides: 72 mg/dL (ref 0–149)
VLDL Cholesterol Cal: 14 mg/dL (ref 5–40)

## 2018-08-16 LAB — HIV ANTIBODY (ROUTINE TESTING W REFLEX): HIV Screen 4th Generation wRfx: NONREACTIVE

## 2018-08-19 ENCOUNTER — Encounter: Payer: Self-pay | Admitting: Nurse Practitioner

## 2018-08-20 LAB — UA/M W/RFLX CULTURE, ROUTINE
Bilirubin, UA: NEGATIVE
Glucose, UA: NEGATIVE
Ketones, UA: NEGATIVE
Nitrite, UA: NEGATIVE
Protein,UA: NEGATIVE
RBC, UA: NEGATIVE
Specific Gravity, UA: 1.02 (ref 1.005–1.030)
Urobilinogen, Ur: 0.2 mg/dL (ref 0.2–1.0)
pH, UA: 8.5 — ABNORMAL HIGH (ref 5.0–7.5)

## 2018-08-20 LAB — MICROSCOPIC EXAMINATION: RBC, Urine: NONE SEEN /hpf (ref 0–2)

## 2018-08-20 LAB — URINE CULTURE, REFLEX

## 2018-08-21 LAB — GC/CHLAMYDIA PROBE AMP
Chlamydia trachomatis, NAA: NEGATIVE
Neisseria Gonorrhoeae by PCR: NEGATIVE

## 2018-08-21 NOTE — Progress Notes (Signed)
Normal test results noted.  Please call patient and make them aware of normal results and will continue to monitor at regular visits.  Have a great day.  Look forward to seeing you at your next visit.

## 2018-09-30 ENCOUNTER — Other Ambulatory Visit: Payer: Self-pay

## 2018-09-30 ENCOUNTER — Ambulatory Visit (INDEPENDENT_AMBULATORY_CARE_PROVIDER_SITE_OTHER): Payer: 59

## 2018-09-30 DIAGNOSIS — Z23 Encounter for immunization: Secondary | ICD-10-CM | POA: Diagnosis not present

## 2019-02-18 ENCOUNTER — Ambulatory Visit (INDEPENDENT_AMBULATORY_CARE_PROVIDER_SITE_OTHER): Payer: 59 | Admitting: Family Medicine

## 2019-02-18 ENCOUNTER — Other Ambulatory Visit: Payer: Self-pay

## 2019-02-18 ENCOUNTER — Other Ambulatory Visit (HOSPITAL_COMMUNITY)
Admission: RE | Admit: 2019-02-18 | Discharge: 2019-02-18 | Disposition: A | Discharge status: Home / Self Care | Payer: 59 | Source: Ambulatory Visit | Attending: Family Medicine | Admitting: Family Medicine

## 2019-02-18 ENCOUNTER — Encounter: Payer: Self-pay | Admitting: Family Medicine

## 2019-02-18 VITALS — BP 115/77 | HR 81 | Temp 97.7°F | Ht 63.0 in | Wt 120.5 lb

## 2019-02-18 DIAGNOSIS — Z01419 Encounter for gynecological examination (general) (routine) without abnormal findings: Secondary | ICD-10-CM

## 2019-02-18 DIAGNOSIS — Z124 Encounter for screening for malignant neoplasm of cervix: Secondary | ICD-10-CM | POA: Diagnosis not present

## 2019-02-18 NOTE — Progress Notes (Signed)
BP 115/77   Pulse 81   Temp 97.7 F (36.5 C) (Oral)   Ht 5\' 3"  (1.6 m)   Wt 120 lb 8 oz (54.7 kg)   SpO2 99%   BMI 21.35 kg/m    Subjective:    Patient ID: Tammy Shaw, female    DOB: September 12, 1995, 24 y.o.   MRN: [id number]  HPI: Tammy Shaw is a 24 y.o. female  Chief Complaint  Patient presents with  . well woman   Here today for her pap. Doing well. No concerns. Feeling well. No issues with her OCP. No other concerns or complaints at this time.   Relevant past medical, surgical, family and social history reviewed and updated as indicated. Interim medical history since our last visit reviewed. Allergies and medications reviewed and updated.  Review of Systems  Constitutional: Negative.   Respiratory: Negative.   Cardiovascular: Negative.   Genitourinary: Negative.   Musculoskeletal: Negative.   Psychiatric/Behavioral: Negative.     Per HPI unless specifically indicated above     Objective:    BP 115/77   Pulse 81   Temp 97.7 F (36.5 C) (Oral)   Ht 5\' 3"  (1.6 m)   Wt 120 lb 8 oz (54.7 kg)   SpO2 99%   BMI 21.35 kg/m   Wt Readings from Last 3 Encounters:  02/18/19 120 lb 8 oz (54.7 kg)  08/15/18 114 lb (51.7 kg)  08/06/17 105 lb (47.6 kg)    Physical Exam Vitals and nursing note reviewed. Exam conducted with a chaperone present.  Constitutional:      General: She is not in acute distress.    Appearance: Normal appearance. She is not ill-appearing, toxic-appearing or diaphoretic.  HENT:     Head: Normocephalic and atraumatic.     Right Ear: External ear normal.     Left Ear: External ear normal.     Nose: Nose normal.     Mouth/Throat:     Mouth: Mucous membranes are moist.     Pharynx: Oropharynx is clear.  Eyes:     General: No scleral icterus.       Right eye: No discharge.        Left eye: No discharge.     Extraocular Movements: Extraocular movements intact.     Conjunctiva/sclera: Conjunctivae normal.     Pupils: Pupils are  equal, round, and reactive to light.  Cardiovascular:     Rate and Rhythm: Normal rate and regular rhythm.     Pulses: Normal pulses.     Heart sounds: Normal heart sounds. No murmur. No friction rub. No gallop.   Pulmonary:     Effort: Pulmonary effort is normal. No respiratory distress.     Breath sounds: Normal breath sounds. No stridor. No wheezing, rhonchi or rales.  Chest:     Chest wall: No tenderness.     Breasts:        Right: Normal. No swelling, bleeding, inverted nipple, mass, nipple discharge, skin change or tenderness.        Left: Normal. No swelling, bleeding, inverted nipple, mass, nipple discharge, skin change or tenderness.  Genitourinary:    Labia:        Right: No rash, tenderness, lesion or injury.        Left: No rash, tenderness, lesion or injury.      Urethra: No prolapse, urethral pain, urethral swelling or urethral lesion.     Vagina: Normal.     Cervix: Normal.  Uterus: Normal.   Musculoskeletal:        General: Normal range of motion.     Cervical back: Normal range of motion and neck supple.  Lymphadenopathy:     Upper Body:     Right upper body: No supraclavicular, axillary or pectoral adenopathy.     Left upper body: No supraclavicular, axillary or pectoral adenopathy.  Skin:    General: Skin is warm and dry.     Capillary Refill: Capillary refill takes less than 2 seconds.     Coloration: Skin is not jaundiced or pale.     Findings: No bruising, erythema, lesion or rash.  Neurological:     General: No focal deficit present.     Mental Status: She is alert and oriented to person, place, and time. Mental status is at baseline.  Psychiatric:        Mood and Affect: Mood normal.        Behavior: Behavior normal.        Thought Content: Thought content normal.        Judgment: Judgment normal.     Results for orders placed or performed in visit on 08/15/18  GC/Chlamydia Probe Amp   Specimen: Urine   UR  Result Value Ref Range   Chlamydia  trachomatis, NAA Negative Negative   Neisseria Gonorrhoeae by PCR Negative Negative  Microscopic Examination   URINE  Result Value Ref Range   WBC, UA 0-5 0 - 5 /hpf   RBC None seen 0 - 2 /hpf   Epithelial Cells (non renal) 0-10 0 - 10 /hpf   Bacteria, UA Few (A) None seen/Few  Urine Culture, Reflex   URINE  Result Value Ref Range   Urine Culture, Routine Final report    Organism ID, Bacteria Comment   CBC with Differential/Platelet  Result Value Ref Range   WBC 4.8 3.4 - 10.8 x10E3/uL   RBC 4.51 3.77 - 5.28 x10E6/uL   Hemoglobin 13.4 11.1 - 15.9 g/dL   Hematocrit 82.4 23.5 - 46.6 %   MCV 89 79 - 97 fL   MCH 29.7 26.6 - 33.0 pg   MCHC 33.4 31.5 - 35.7 g/dL   RDW 36.1 44.3 - 15.4 %   Platelets 232 150 - 450 x10E3/uL   Neutrophils 61 Not Estab. %   Lymphs 29 Not Estab. %   Monocytes 5 Not Estab. %   Eos 4 Not Estab. %   Basos 1 Not Estab. %   Neutrophils Absolute 3.0 1.4 - 7.0 x10E3/uL   Lymphocytes Absolute 1.4 0.7 - 3.1 x10E3/uL   Monocytes Absolute 0.2 0.1 - 0.9 x10E3/uL   EOS (ABSOLUTE) 0.2 0.0 - 0.4 x10E3/uL   Basophils Absolute 0.1 0.0 - 0.2 x10E3/uL   Immature Granulocytes 0 Not Estab. %   Immature Grans (Abs) 0.0 0.0 - 0.1 x10E3/uL  Comprehensive metabolic panel  Result Value Ref Range   Glucose 86 65 - 99 mg/dL   BUN 10 6 - 20 mg/dL   Creatinine, Ser 0.08 0.57 - 1.00 mg/dL   GFR calc non Af Amer 125 >59 mL/min/1.73   GFR calc Af Amer 145 >59 mL/min/1.73   BUN/Creatinine Ratio 15 9 - 23   Sodium 139 134 - 144 mmol/L   Potassium 4.2 3.5 - 5.2 mmol/L   Chloride 101 96 - 106 mmol/L   CO2 23 20 - 29 mmol/L   Calcium 9.8 8.7 - 10.2 mg/dL   Total Protein 7.4 6.0 - 8.5 g/dL  Albumin 4.8 3.9 - 5.0 g/dL   Globulin, Total 2.6 1.5 - 4.5 g/dL   Albumin/Globulin Ratio 1.8 1.2 - 2.2   Bilirubin Total 0.3 0.0 - 1.2 mg/dL   Alkaline Phosphatase 55 39 - 117 IU/L   AST 18 0 - 40 IU/L   ALT 9 0 - 32 IU/L  Lipid Panel w/o Chol/HDL Ratio  Result Value Ref Range    Cholesterol, Total 177 100 - 199 mg/dL   Triglycerides 72 0 - 149 mg/dL   HDL 51 >99 mg/dL   VLDL Cholesterol Cal 14 5 - 40 mg/dL   LDL Calculated 242 (H) 0 - 99 mg/dL  TSH  Result Value Ref Range   TSH 2.270 0.450 - 4.500 uIU/mL  UA/M w/rflx Culture, Routine   Specimen: Urine   URINE  Result Value Ref Range   Specific Gravity, UA 1.020 1.005 - 1.030   pH, UA 8.5 (H) 5.0 - 7.5   Color, UA Yellow Yellow   Appearance Ur Clear Clear   Leukocytes,UA Trace (A) Negative   Protein,UA Negative Negative/Trace   Glucose, UA Negative Negative   Ketones, UA Negative Negative   RBC, UA Negative Negative   Bilirubin, UA Negative Negative   Urobilinogen, Ur 0.2 0.2 - 1.0 mg/dL   Nitrite, UA Negative Negative   Microscopic Examination See below:    Urinalysis Reflex Comment   HIV antibody (with reflex)  Result Value Ref Range   HIV Screen 4th Generation wRfx Non Reactive Non Reactive      Assessment & Plan:   Problem List Items Addressed This Visit    None    Visit Diagnoses    Well woman exam    -  Primary   Doing well. Pap and breast exam done today. Call with any concerns.    Screening for cervical cancer       Pap done today.   Relevant Orders   Cytology - PAP       Follow up plan: Return in about 6 months (around 08/18/2019) for Physical.

## 2019-02-20 LAB — CYTOLOGY - PAP
Adequacy: ABSENT
Chlamydia: NEGATIVE
Comment: NEGATIVE
Comment: NORMAL
Neisseria Gonorrhea: NEGATIVE

## 2019-04-05 ENCOUNTER — Ambulatory Visit: Payer: 59 | Attending: Internal Medicine

## 2019-04-05 DIAGNOSIS — Z23 Encounter for immunization: Secondary | ICD-10-CM

## 2019-04-05 NOTE — Progress Notes (Signed)
   Covid-19 Vaccination Clinic  Name:  Tammy Shaw    MRN: [id number] DOB: [birth year]  04/05/2019  Ms. Tammy Shaw was observed post Covid-19 immunization for 15 minutes without incident. She was provided with Vaccine Information Sheet and instruction to access the V-Safe system.   Ms. Tammy Shaw was instructed to call 911 with any severe reactions post vaccine: Marland Kitchen Difficulty breathing  . Swelling of face and throat  . A fast heartbeat  . A bad rash all over body  . Dizziness and weakness   Immunizations Administered    Name Date Dose VIS Date Route   Pfizer COVID-19 Vaccine 04/05/2019 12:44 PM 0.3 mL 12/20/2018 Intramuscular   Manufacturer: [employer], [employer]   Lot: [id number]   NDC: [address]-2

## 2019-04-26 ENCOUNTER — Ambulatory Visit: Payer: 59 | Attending: Internal Medicine

## 2019-04-26 DIAGNOSIS — Z23 Encounter for immunization: Secondary | ICD-10-CM

## 2019-04-26 NOTE — Progress Notes (Signed)
   Covid-19 Vaccination Clinic  Name:  Tammy Shaw    MRN: [id number] DOB: 05-08-1995  04/26/2019  [name] was observed post Covid-19 immunization for 15 minutes without incident. She was provided with Vaccine Information Sheet and instruction to access the V-Safe system.   [name] was instructed to call 911 with any severe reactions post vaccine: Marland Kitchen Difficulty breathing  . Swelling of face and throat  . A fast heartbeat  . A bad rash all over body  . Dizziness and weakness   Immunizations Administered    Name Date Dose VIS Date Route   Pfizer COVID-19 Vaccine 04/26/2019 12:28 PM 0.3 mL 12/20/2018 Intramuscular   Manufacturer: [employer], [employer]   Lot: [id number]   NDC: [address]-9

## 2019-10-21 ENCOUNTER — Encounter: Payer: Self-pay | Admitting: Family Medicine

## 2019-10-30 ENCOUNTER — Other Ambulatory Visit: Payer: Self-pay | Admitting: Nurse Practitioner

## 2019-10-30 DIAGNOSIS — N921 Excessive and frequent menstruation with irregular cycle: Secondary | ICD-10-CM

## 2019-10-30 MED ORDER — NORETHINDRONE ACET-ETHINYL EST 1.5-30 MG-MCG PO TABS: 1.0000 | ORAL_TABLET | Freq: Every day | ORAL | 12 refills | Status: DC

## 2020-12-24 ENCOUNTER — Other Ambulatory Visit: Payer: Self-pay | Admitting: Nurse Practitioner

## 2020-12-24 DIAGNOSIS — N921 Excessive and frequent menstruation with irregular cycle: Secondary | ICD-10-CM

## 2020-12-24 NOTE — Telephone Encounter (Signed)
Requested medication (s) are due for refill today: yes  Requested medication (s) are on the active medication list: yes  Last refill:  10/08/20  Future visit scheduled: no  Notes to clinic:  was last seen 02/18/2019, given curtesy refill 10/08/20 with request to come in for liver function labs, pt has not been in, no upcoming appt scheduled.   Requested Prescriptions  Pending Prescriptions Disp Refills   AUROVELA 1.5/30 1.5-30 MG-MCG tablet [Pharmacy Med Name: AUROVELA 1.5/30 TABLETS 21] 21 tablet     Sig: TAKE 1 TABLET BY MOUTH EVERY DAY     OB/GYN:  Contraceptives Failed - 12/24/2020  3:38 AM      Failed - Valid encounter within last 12 months    Recent Outpatient Visits           1 year ago Well woman exam   [facility] Dorcas Carrow, DO   2 years ago Routine general medical examination at a health care facility   [facility] [place] P, DO   2 years ago Close Exposure to Covid-19 Virus   [facility] [place], Megan P, DO   3 years ago Menorrhagia with irregular cycle   [facility] Osvaldo Angst M, PA-C   4 years ago Screening for HIV without presence of risk factors   [facility] Gabriel Cirri, NP              Passed - Last BP in normal range    BP Readings from Last 1 Encounters:  02/18/19 115/77

## 2020-12-25 NOTE — Telephone Encounter (Signed)
Requested Prescriptions  Pending Prescriptions Disp Refills   AUROVELA 1.5/30 1.5-30 MG-MCG tablet [Pharmacy Med Name: AUROVELA 1.5/30 TABLETS 21] 21 tablet     Sig: TAKE 1 TABLET BY MOUTH EVERY DAY     OB/GYN:  Contraceptives Failed - 12/24/2020 12:28 PM      Failed - Valid encounter within last 12 months    Recent Outpatient Visits          1 year ago Well woman exam   [facility] Dorcas Carrow, DO   2 years ago Routine general medical examination at a health care facility   [facility], [place] P, DO   2 years ago Close Exposure to Covid-19 Virus   [facility] [place], Megan P, DO   3 years ago Menorrhagia with irregular cycle   [facility] Osvaldo Angst M, PA-C   4 years ago Screening for HIV without presence of risk factors   [facility] Gabriel Cirri, NP             Passed - Last BP in normal range    BP Readings from Last 1 Encounters:  02/18/19 115/77

## 2020-12-27 ENCOUNTER — Other Ambulatory Visit: Payer: Self-pay | Admitting: Nurse Practitioner

## 2020-12-27 DIAGNOSIS — N921 Excessive and frequent menstruation with irregular cycle: Secondary | ICD-10-CM

## 2020-12-27 MED ORDER — NORETHINDRONE ACET-ETHINYL EST 1.5-30 MG-MCG PO TABS: 1.0000 | ORAL_TABLET | Freq: Every day | ORAL | 12 refills | Status: DC

## 2020-12-27 NOTE — Telephone Encounter (Signed)
Pt scheduled an appt with [facility] for 12/21.

## 2020-12-27 NOTE — Telephone Encounter (Signed)
Lmom asking pt to call back to schedule an appt. °

## 2020-12-28 DIAGNOSIS — Z789 Other specified health status: Secondary | ICD-10-CM | POA: Insufficient documentation

## 2020-12-29 ENCOUNTER — Ambulatory Visit (INDEPENDENT_AMBULATORY_CARE_PROVIDER_SITE_OTHER): Payer: Managed Care, Other (non HMO) | Admitting: Nurse Practitioner

## 2020-12-29 ENCOUNTER — Encounter: Payer: Self-pay | Admitting: Nurse Practitioner

## 2020-12-29 ENCOUNTER — Other Ambulatory Visit: Payer: Self-pay

## 2020-12-29 DIAGNOSIS — Z789 Other specified health status: Secondary | ICD-10-CM

## 2020-12-29 LAB — PREGNANCY, URINE: Preg Test, Ur: NEGATIVE

## 2020-12-29 NOTE — Assessment & Plan Note (Signed)
Ongoing, no acute concerns today.  Urine pregnancy testing negative.  Check CBC and CMP today.  Return in one year.  Refills sent in.

## 2020-12-29 NOTE — Patient Instructions (Signed)
Healthy Eating °Following a healthy eating pattern may help you to achieve and maintain a healthy body weight, reduce the risk of chronic disease, and live a long and productive life. It is important to follow a healthy eating pattern at an appropriate calorie level for your body. Your nutritional needs should be met primarily through food by choosing a variety of nutrient-rich foods. °What are tips for following this plan? °Reading food labels °Read labels and choose the following: °Reduced or low sodium. °Juices with 100% fruit juice. °Foods with low saturated fats and high polyunsaturated and monounsaturated fats. °Foods with whole grains, such as whole wheat, cracked wheat, brown rice, and wild rice. °Whole grains that are fortified with folic acid. This is recommended for women who are pregnant or who want to become pregnant. °Read labels and avoid the following: °Foods with a lot of added sugars. These include foods that contain brown sugar, corn sweetener, corn syrup, dextrose, fructose, glucose, high-fructose corn syrup, honey, invert sugar, lactose, malt syrup, maltose, molasses, raw sugar, sucrose, trehalose, or turbinado sugar. °Do not eat more than the following amounts of added sugar per day: °6 teaspoons (25 g) for women. °9 teaspoons (38 g) for men. °Foods that contain processed or refined starches and grains. °Refined grain products, such as white flour, degermed cornmeal, white bread, and white rice. °Shopping °Choose nutrient-rich snacks, such as vegetables, whole fruits, and nuts. Avoid high-calorie and high-sugar snacks, such as potato chips, fruit snacks, and candy. °Use oil-based dressings and spreads on foods instead of solid fats such as butter, stick margarine, or cream cheese. °Limit pre-made sauces, mixes, and "instant" products such as flavored rice, instant noodles, and ready-made pasta. °Try more plant-protein sources, such as tofu, tempeh, black beans, edamame, lentils, nuts, and  seeds. °Explore eating plans such as the Mediterranean diet or vegetarian diet. °Cooking °Use oil to sauté or stir-fry foods instead of solid fats such as butter, stick margarine, or lard. °Try baking, boiling, grilling, or broiling instead of frying. °Remove the fatty part of meats before cooking. °Steam vegetables in water or broth. °Meal planning ° °At meals, imagine dividing your plate into fourths: °One-half of your plate is fruits and vegetables. °One-fourth of your plate is whole grains. °One-fourth of your plate is protein, especially lean meats, poultry, eggs, tofu, beans, or nuts. °Include low-fat dairy as part of your daily diet. °Lifestyle °Choose healthy options in all settings, including home, work, school, restaurants, or stores. °Prepare your food safely: °Wash your hands after handling raw meats. °Keep food preparation surfaces clean by regularly washing with hot, soapy water. °Keep raw meats separate from ready-to-eat foods, such as fruits and vegetables. °Cook seafood, meat, poultry, and eggs to the recommended internal temperature. °Store foods at safe temperatures. In general: °Keep cold foods at 40°F (4.4°C) or below. °Keep hot foods at 140°F (60°C) or above. °Keep your freezer at 0°F (-17.8°C) or below. °Foods are no longer safe to eat when they have been between the temperatures of 40°-140°F (4.4-60°C) for more than 2 hours. °What foods should I eat? °Fruits °Aim to eat 2 cup-equivalents of fresh, canned (in natural juice), or frozen fruits each day. Examples of 1 cup-equivalent of fruit include 1 small apple, 8 large strawberries, 1 cup canned fruit, ½ cup dried fruit, or 1 cup 100% juice. °Vegetables °Aim to eat 2½-3 cup-equivalents of fresh and frozen vegetables each day, including different varieties and colors. Examples of 1 cup-equivalent of vegetables include 2 medium carrots, 2 cups raw,   leafy greens, 1 cup chopped vegetable (raw or cooked), or 1 medium baked potato. °Grains °Aim to  eat 6 ounce-equivalents of whole grains each day. Examples of 1 ounce-equivalent of grains include 1 slice of bread, 1 cup ready-to-eat cereal, 3 cups popcorn, or ½ cup cooked rice, pasta, or cereal. °Meats and other proteins °Aim to eat 5-6 ounce-equivalents of protein each day. Examples of 1 ounce-equivalent of protein include 1 egg, 1/2 cup nuts or seeds, or 1 tablespoon (16 g) peanut butter. A cut of meat or fish that is the size of a deck of cards is about 3-4 ounce-equivalents. °Of the protein you eat each week, try to have at least 8 ounces come from seafood. This includes salmon, trout, herring, and anchovies. °Dairy °Aim to eat 3 cup-equivalents of fat-free or low-fat dairy each day. Examples of 1 cup-equivalent of dairy include 1 cup (240 mL) milk, 8 ounces (250 g) yogurt, 1½ ounces (44 g) natural cheese, or 1 cup (240 mL) fortified soy milk. °Fats and oils °Aim for about 5 teaspoons (21 g) per day. Choose monounsaturated fats, such as canola and olive oils, avocados, peanut butter, and most nuts, or polyunsaturated fats, such as sunflower, corn, and soybean oils, walnuts, pine nuts, sesame seeds, sunflower seeds, and flaxseed. °Beverages °Aim for six 8-oz glasses of water per day. Limit coffee to three to five 8-oz cups per day. °Limit caffeinated beverages that have added calories, such as soda and energy drinks. °Limit alcohol intake to no more than 1 drink a day for nonpregnant women and 2 drinks a day for men. One drink equals 12 oz of beer (355 mL), 5 oz of wine (148 mL), or 1½ oz of hard liquor (44 mL). °Seasoning and other foods °Avoid adding excess amounts of salt to your foods. Try flavoring foods with herbs and spices instead of salt. °Avoid adding sugar to foods. °Try using oil-based dressings, sauces, and spreads instead of solid fats. °This information is based on general [place] nutrition guidelines. For more information, visit choosemyplate.gov. Exact amounts may vary based on your nutrition  needs. °Summary °A healthy eating plan may help you to maintain a healthy weight, reduce the risk of chronic diseases, and stay active throughout your life. °Plan your meals. Make sure you eat the right portions of a variety of nutrient-rich foods. °Try baking, boiling, grilling, or broiling instead of frying. °Choose healthy options in all settings, including home, work, school, restaurants, or stores. °This information is not intended to replace advice given to you by your health care provider. Make sure you discuss any questions you have with your health care provider. °Document Revised: 08/24/2020 Document Reviewed: 08/24/2020 °Elsevier Patient Education © 2022 [employer] ° °

## 2020-12-29 NOTE — Progress Notes (Signed)
Temp 98.3 F (36.8 C) (Oral)    Ht 5\' 2"  (1.575 m)    Wt 136 lb 12.8 oz (62.1 kg)    SpO2 97%    BMI 25.02 kg/m    Subjective:    Patient ID: , female    DOB: 20-Dec-1995, 25 y.o.   MRN: [id number]  HPI: Tammy Shaw is a 26 y.o. female  Chief Complaint  Patient presents with   Contraception    Patient is here for medication refill on her birth control. Patient denies having any concerns at today's visit.    BIRTH CONTROL: Patient wishes to continue birth control for cycle regulation.  She denies any history of DVT/PE, migraine with aura, cancer, blood clotting disorder or tobacco use.  Instructed to continue as ordered.  Instructed as to importance of taking medication as directed, and encouraged to use condoms as well if at risk for STIs.  Not currently sexually active.  No history of STDs.  Relevant past medical, surgical, family and social history reviewed and updated as indicated. Interim medical history since our last visit reviewed. Allergies and medications reviewed and updated.  Review of Systems  Constitutional:  Negative for activity change, appetite change, diaphoresis, fatigue and fever.  Respiratory:  Negative for cough, chest tightness and shortness of breath.   Cardiovascular:  Negative for chest pain, palpitations and leg swelling.  Gastrointestinal: Negative.   Neurological: Negative.   Psychiatric/Behavioral: Negative.     Per HPI unless specifically indicated above     Objective:    Temp 98.3 F (36.8 C) (Oral)    Ht 5\' 2"  (1.575 m)    Wt 136 lb 12.8 oz (62.1 kg)    SpO2 97%    BMI 25.02 kg/m   Wt Readings from Last 3 Encounters:  12/29/20 136 lb 12.8 oz (62.1 kg)  02/18/19 120 lb 8 oz (54.7 kg)  08/15/18 114 lb (51.7 kg)    Physical Exam Vitals and nursing note reviewed.  Constitutional:      General: She is awake. She is not in acute distress.    Appearance: She is well-developed and well-groomed. She is not ill-appearing  or toxic-appearing.  HENT:     Head: Normocephalic.     Right Ear: Hearing normal.     Left Ear: Hearing normal.     Nose: Nose normal.     Mouth/Throat:     Mouth: Mucous membranes are moist.  Eyes:     General: Lids are normal.        Right eye: No discharge.        Left eye: No discharge.     Conjunctiva/sclera: Conjunctivae normal.     Pupils: Pupils are equal, round, and reactive to light.  Neck:     Thyroid: No thyromegaly.     Vascular: No carotid bruit.  Cardiovascular:     Rate and Rhythm: Normal rate and regular rhythm.     Heart sounds: Normal heart sounds. No murmur heard.   No gallop.  Pulmonary:     Effort: Pulmonary effort is normal. No accessory muscle usage or respiratory distress.     Breath sounds: Normal breath sounds.  Abdominal:     General: Bowel sounds are normal.     Palpations: Abdomen is soft. There is no hepatomegaly or splenomegaly.  Musculoskeletal:     Cervical back: Normal range of motion and neck supple.     Right lower leg: No edema.  Left lower leg: No edema.  Skin:    General: Skin is warm and dry.  Neurological:     Mental Status: She is alert and oriented to person, place, and time.  Psychiatric:        Attention and Perception: Attention normal.        Mood and Affect: Mood normal.        Speech: Speech normal.        Behavior: Behavior normal. Behavior is cooperative.        Thought Content: Thought content normal.    Results for orders placed or performed in visit on 02/18/19  Cytology - PAP  Result Value Ref Range   Neisseria Gonorrhea Negative    Chlamydia Negative    Adequacy      Satisfactory for evaluation; transformation zone component ABSENT.   Diagnosis - Low grade squamous intraepithelial lesion (LSIL) (A)    Comment Normal Reference Ranger Chlamydia - Negative    Comment      Normal Reference Range Neisseria Gonorrhea - Negative      Assessment & Plan:   Problem List Items Addressed This Visit        Other   Uses birth control    Ongoing, no acute concerns today.  Urine pregnancy testing negative.  Check CBC and CMP today.  Return in one year.  Refills sent in.      Relevant Orders   Pregnancy, urine   CBC with Differential/Platelet   Comprehensive metabolic panel     Follow up plan: Return in about 1 year (around 12/29/2021) for Annual physical.

## 2020-12-30 ENCOUNTER — Ambulatory Visit: Payer: Managed Care, Other (non HMO) | Admitting: Family Medicine

## 2020-12-30 LAB — COMPREHENSIVE METABOLIC PANEL
ALT: 12 IU/L (ref 0–32)
AST: 14 IU/L (ref 0–40)
Albumin/Globulin Ratio: 1.4 (ref 1.2–2.2)
Albumin: 4.3 g/dL (ref 3.9–5.0)
Alkaline Phosphatase: 62 IU/L (ref 44–121)
BUN/Creatinine Ratio: 16 (ref 9–23)
BUN: 12 mg/dL (ref 6–20)
Bilirubin Total: 0.3 mg/dL (ref 0.0–1.2)
CO2: 22 mmol/L (ref 20–29)
Calcium: 9.3 mg/dL (ref 8.7–10.2)
Chloride: 102 mmol/L (ref 96–106)
Creatinine, Ser: 0.73 mg/dL (ref 0.57–1.00)
Globulin, Total: 3 g/dL (ref 1.5–4.5)
Glucose: 96 mg/dL (ref 70–99)
Potassium: 4.2 mmol/L (ref 3.5–5.2)
Sodium: 139 mmol/L (ref 134–144)
Total Protein: 7.3 g/dL (ref 6.0–8.5)
eGFR: 118 mL/min/{1.73_m2} (ref >59)

## 2020-12-30 LAB — CBC WITH DIFFERENTIAL/PLATELET
Basophils Absolute: 0 10*3/uL (ref 0.0–0.2)
Basos: 1 %
EOS (ABSOLUTE): 0.2 10*3/uL (ref 0.0–0.4)
Eos: 3 %
Hematocrit: 39.6 % (ref 34.0–46.6)
Hemoglobin: 13.3 g/dL (ref 11.1–15.9)
Immature Grans (Abs): 0 10*3/uL (ref 0.0–0.1)
Immature Granulocytes: 0 %
Lymphocytes Absolute: 1.6 10*3/uL (ref 0.7–3.1)
Lymphs: 24 %
MCH: 29.8 pg (ref 26.6–33.0)
MCHC: 33.6 g/dL (ref 31.5–35.7)
MCV: 89 fL (ref 79–97)
Monocytes Absolute: 0.5 10*3/uL (ref 0.1–0.9)
Monocytes: 8 %
Neutrophils Absolute: 4.2 10*3/uL (ref 1.4–7.0)
Neutrophils: 64 %
Platelets: 288 10*3/uL (ref 150–450)
RBC: 4.47 x10E6/uL (ref 3.77–5.28)
RDW: 12.4 % (ref 11.7–15.4)
WBC: 6.6 10*3/uL (ref 3.4–10.8)

## 2020-12-30 NOTE — Progress Notes (Signed)
Contacted via MyChart   Good morning Talecia, your blood work has all returned normal.  Continue current birth control regimen . Any questions? Keep being awesome!!  Thank you for allowing me to participate in your care.  I appreciate you. Kindest regards, Holden Draughon

## 2021-04-29 ENCOUNTER — Encounter: Payer: Self-pay | Admitting: Nurse Practitioner

## 2021-05-16 ENCOUNTER — Ambulatory Visit (INDEPENDENT_AMBULATORY_CARE_PROVIDER_SITE_OTHER): Payer: Managed Care, Other (non HMO) | Admitting: Nurse Practitioner

## 2021-05-16 ENCOUNTER — Encounter: Payer: Self-pay | Admitting: Nurse Practitioner

## 2021-05-16 DIAGNOSIS — L710 Perioral dermatitis: Secondary | ICD-10-CM | POA: Diagnosis not present

## 2021-05-16 MED ORDER — DOXYCYCLINE HYCLATE 100 MG PO TABS: 100.0000 mg | ORAL_TABLET | Freq: Two times a day (BID) | ORAL | 0 refills | Status: DC

## 2021-05-16 MED ORDER — METRONIDAZOLE 0.75 % EX GEL: 1.0000 "application " | Freq: Every day | CUTANEOUS | 1 refills | Status: DC

## 2021-05-16 NOTE — Patient Instructions (Addendum)
Atopic Dermatitis ?Atopic dermatitis is a skin disorder that causes inflammation of the skin. It is marked by a red rash and itchy, dry, scaly skin. It is the most common type of eczema. Eczema is a group of skin conditions that cause the skin to become rough and swollen. This condition is generally worse during the cooler winter months and often improves during the warm summer months. ?Atopic dermatitis usually starts showing signs in infancy and can last through adulthood. This condition cannot be passed from one person to another (is not contagious). Atopic dermatitis may not always be present, but when it is, it is called a flare-up. ?What are the causes? ?The exact cause of this condition is not known. Flare-ups may be triggered by: ?Coming in contact with something that you are sensitive or allergic to (allergen). ?Stress. ?Certain foods. ?Extremely hot or cold weather. ?Harsh chemicals and soaps. ?Dry air. ?Chlorine. ?What increases the risk? ?This condition is more likely to develop in people who have a personal or family history of: ?Eczema. ?Allergies. ?Asthma. ?Hay fever. ?What are the signs or symptoms? ?Symptoms of this condition include: ?Dry, scaly skin. ?Red, itchy rash. ?Itchiness, which can be severe. This may occur before the skin rash. This can make sleeping difficult. ?Skin thickening and cracking that can occur over time. ?How is this diagnosed? ?This condition is diagnosed based on: ?Your symptoms. ?Your medical history. ?A physical exam. ?How is this treated? ?There is no cure for this condition, but symptoms can usually be controlled. Treatment focuses on: ?Controlling the itchiness and scratching. You may be given medicines, such as antihistamines or steroid creams. ?Limiting exposure to allergens. ?Recognizing situations that cause stress and developing a plan to manage stress. ?If your atopic dermatitis does not get better with medicines, or if it is all over your body (widespread), a  treatment using a specific type of light (phototherapy) may be used. ?Follow these instructions at home: ?Skin care ? ?Keep your skin well moisturized. Doing this seals in moisture and helps to prevent dryness. ?Use unscented lotions that have petroleum in them. ?Avoid lotions that contain alcohol or water. They can dry the skin. ?Keep baths or showers short (less than 5 minutes) in warm water. Do not use hot water. ?Use mild, unscented cleansers for bathing. Avoid soap and bubble bath. ?Apply a moisturizer to your skin right after a bath or shower. ?Do not apply anything to your skin without checking with your health care provider. ?General instructions ?Take or apply over-the-counter and prescription medicines only as told by your health care provider. ?Dress in clothes made of cotton or cotton blends. Dress lightly because heat increases itchiness. ?When washing your clothes, rinse your clothes twice so all of the soap is removed. ?Avoid any triggers that can cause a flare-up. ?Keep your fingernails cut short. ?Avoid scratching. Scratching makes the rash and itchiness worse. A break in the skin from scratching could result in a skin infection (impetigo). ?Do not be around people who have cold sores or fever blisters. If you get the infection, it may cause your atopic dermatitis to worsen. ?Keep all follow-up visits. This is important. ?Contact a health care provider if: ?Your itchiness interferes with sleep. ?Your rash gets worse or is not better within one week of starting treatment. ?You have a fever. ?You have a rash flare-up after having contact with someone who has cold sores or fever blisters. ?Get help right away if: ?You develop pus or soft yellow scabs in the rash   area. ?Summary ?Atopic dermatitis causes a red rash and itchy, dry, scaly skin. ?Treatment focuses on controlling the itchiness and scratching, limiting exposure to things that you are sensitive or allergic to (allergens), recognizing  situations that cause stress, and developing a plan to manage stress. ?Keep your skin well moisturized. ?Keep baths or showers shorter than 5 minutes and use warm water. Do not use hot water. ?This information is not intended to replace advice given to you by your health care provider. Make sure you discuss any questions you have with your health care provider. ?Document Revised: 10/06/2019 Document Reviewed: 10/06/2019 ?Elsevier Patient Education ? 2023 [employer] ? ?

## 2021-05-16 NOTE — Assessment & Plan Note (Signed)
Ongoing for 24 hours -- around mouth area and some extension of small papules up around nose. Will start Doxycyline 100 MG BID for 10 days and add on Metrogel due to significance of rash and irritation.  Advised her to take Claritin 10 MG daily for the next 10 days to help with burning and itching.  Monitor skin products and use unscented products.  Educated on this diagnosis at length. Was advised that while on abx oral treatment to use condoms if sexually active as abx may affect birth control.  Return in 10 days. ?

## 2021-05-16 NOTE — Progress Notes (Signed)
? ?BP 104/64   Pulse 76   Temp 98.8 ?F (37.1 ?C) (Oral)   Ht '5\' 2"'  (1.575 m)   Wt 134 lb 6.4 oz (61 kg)   SpO2 97%   BMI 24.58 kg/m?   ? ?Subjective:  ? ? Patient ID: Tammy Shaw, female    DOB: 28-May-1995, 26 y.o.   MRN: [id number] ? ?HPI: ?Tammy Shaw is a 26 y.o. female ? ?Chief Complaint  ?Patient presents with  ? Rash  ?  Patient is here for Rash. Patient states she noticed yesterday her face was red and she noticed a little bit of those bumps on her face. Patient says today she has noticed the rash has spread. Patient states she tried Benadryl yesterday and states it helped a little bit for the itching. Patient declines changing anything out of her normal routine.   ? ?RASH ?Started yesterday to around mouth area and now extended up to around nose with little white pustules. ?Duration:  days  ?Location: face  ?Itching: yes ?Burning: yes ?Redness: yes ?Oozing: no ?Scaling: no ?Blisters: yes ?Painful: no ?Fevers: no ?Change in detergents/soaps/personal care products: no ?Recent illness: no ?Recent travel:no ?History of same: no ?Context: fluctuating ?Alleviating factors: Benadryl helped itching ?Treatments attempted: Benadryl and regular moisturizer ?Shortness of breath: no  ?Throat/tongue swelling: no ?Myalgias/arthralgias: no  ? ?Relevant past medical, surgical, family and social history reviewed and updated as indicated. Interim medical history since our last visit reviewed. ?Allergies and medications reviewed and updated. ? ?Review of Systems  ?Constitutional:  Negative for activity change, appetite change, diaphoresis, fatigue and fever.  ?Respiratory:  Negative for cough, chest tightness and shortness of breath.   ?Cardiovascular:  Negative for chest pain, palpitations and leg swelling.  ?Gastrointestinal: Negative.   ?Skin:  Positive for rash.  ?Neurological: Negative.   ?Psychiatric/Behavioral: Negative.    ? ?Per HPI unless specifically indicated above ? ?   ?Objective:  ?  ?BP  104/64   Pulse 76   Temp 98.8 ?F (37.1 ?C) (Oral)   Ht '5\' 2"'  (1.575 m)   Wt 134 lb 6.4 oz (61 kg)   SpO2 97%   BMI 24.58 kg/m?   ?Wt Readings from Last 3 Encounters:  ?05/16/21 134 lb 6.4 oz (61 kg)  ?12/29/20 136 lb 12.8 oz (62.1 kg)  ?02/18/19 120 lb 8 oz (54.7 kg)  ?  ?Physical Exam ?Vitals and nursing note reviewed.  ?Constitutional:   ?   General: She is awake. She is not in acute distress. ?   Appearance: She is well-developed and well-groomed. She is not ill-appearing or toxic-appearing.  ?HENT:  ?   Head: Normocephalic.  ?   Right Ear: Hearing normal.  ?   Left Ear: Hearing normal.  ?   Nose: Nose normal.  ?   Mouth/Throat:  ?   Mouth: Mucous membranes are moist.  ?Eyes:  ?   General: Lids are normal.     ?   Right eye: No discharge.     ?   Left eye: No discharge.  ?   Conjunctiva/sclera: Conjunctivae normal.  ?   Pupils: Pupils are equal, round, and reactive to light.  ?Neck:  ?   Thyroid: No thyromegaly.  ?   Vascular: No carotid bruit.  ?Cardiovascular:  ?   Rate and Rhythm: Normal rate and regular rhythm.  ?   Heart sounds: Normal heart sounds. No murmur heard. ?  No gallop.  ?Pulmonary:  ?   Effort:  Pulmonary effort is normal. No accessory muscle usage or respiratory distress.  ?   Breath sounds: Normal breath sounds.  ?Abdominal:  ?   General: Bowel sounds are normal.  ?   Palpations: Abdomen is soft.  ?Musculoskeletal:  ?   Cervical back: Normal range of motion and neck supple.  ?   Right lower leg: No edema.  ?   Left lower leg: No edema.  ?Skin: ?   General: Skin is warm and dry.  ?   Findings: Rash present. Rash is papular and pustular.  ?   Comments: Rash around crevices of exterior mouth, extending outwards.  Clustered small papules with pustule like presentation, mild erythema at base.  Significant rash present around exterior lateral right side of mouth.  Small extension upwards towards nares of papules.  ?Neurological:  ?   Mental Status: She is alert and oriented to person, place, and  time.  ?Psychiatric:     ?   Attention and Perception: Attention normal.     ?   Mood and Affect: Mood normal.     ?   Speech: Speech normal.     ?   Behavior: Behavior normal. Behavior is cooperative.     ?   Thought Content: Thought content normal.  ? ? ?Results for orders placed or performed in visit on 12/29/20  ?Pregnancy, urine  ?Result Value Ref Range  ? Preg Test, Ur Negative Negative  ?CBC with Differential/Platelet  ?Result Value Ref Range  ? WBC 6.6 3.4 - 10.8 x10E3/uL  ? RBC 4.47 3.77 - 5.28 x10E6/uL  ? Hemoglobin 13.3 11.1 - 15.9 g/dL  ? Hematocrit 39.6 34.0 - 46.6 %  ? MCV 89 79 - 97 fL  ? MCH 29.8 26.6 - 33.0 pg  ? MCHC 33.6 31.5 - 35.7 g/dL  ? RDW 12.4 11.7 - 15.4 %  ? Platelets 288 150 - 450 x10E3/uL  ? Neutrophils 64 Not Estab. %  ? Lymphs 24 Not Estab. %  ? Monocytes 8 Not Estab. %  ? Eos 3 Not Estab. %  ? Basos 1 Not Estab. %  ? Neutrophils Absolute 4.2 1.4 - 7.0 x10E3/uL  ? Lymphocytes Absolute 1.6 0.7 - 3.1 x10E3/uL  ? Monocytes Absolute 0.5 0.1 - 0.9 x10E3/uL  ? EOS (ABSOLUTE) 0.2 0.0 - 0.4 x10E3/uL  ? Basophils Absolute 0.0 0.0 - 0.2 x10E3/uL  ? Immature Granulocytes 0 Not Estab. %  ? Immature Grans (Abs) 0.0 0.0 - 0.1 x10E3/uL  ?Comprehensive metabolic panel  ?Result Value Ref Range  ? Glucose 96 70 - 99 mg/dL  ? BUN 12 6 - 20 mg/dL  ? Creatinine, Ser 0.73 0.57 - 1.00 mg/dL  ? eGFR 118 >59 mL/min/1.73  ? BUN/Creatinine Ratio 16 9 - 23  ? Sodium 139 134 - 144 mmol/L  ? Potassium 4.2 3.5 - 5.2 mmol/L  ? Chloride 102 96 - 106 mmol/L  ? CO2 22 20 - 29 mmol/L  ? Calcium 9.3 8.7 - 10.2 mg/dL  ? Total Protein 7.3 6.0 - 8.5 g/dL  ? Albumin 4.3 3.9 - 5.0 g/dL  ? Globulin, Total 3.0 1.5 - 4.5 g/dL  ? Albumin/Globulin Ratio 1.4 1.2 - 2.2  ? Bilirubin Total 0.3 0.0 - 1.2 mg/dL  ? Alkaline Phosphatase 62 44 - 121 IU/L  ? AST 14 0 - 40 IU/L  ? ALT 12 0 - 32 IU/L  ? ?   ?Assessment & Plan:  ? ?Problem List Items Addressed This Visit   ? ?  ?  Digestive  ? Perioral dermatitis  ?  Ongoing for 24 hours --  around mouth area and some extension of small papules up around nose. Will start Doxycyline 100 MG BID for 10 days and add on Metrogel due to significance of rash and irritation.  Advised her to take Claritin 10 MG daily for the next 10 days to help with burning and itching.  Monitor skin products and use unscented products.  Educated on this diagnosis at length. Was advised that while on abx oral treatment to use condoms if sexually active as abx may affect birth control.  Return in 10 days. ? ?  ?  ?  ? ?Follow up plan: ?Return in about 10 days (around 05/26/2021) for Perioral dermatitis. ? ? ? ? ? ?

## 2021-05-22 NOTE — Patient Instructions (Signed)
Perioral dermatitis  Perioral dermatitis is an eruption which is usually located around the mouth and nose.  It can be a rash and/or red bumps.  It occasionally occurs around the eyes.  It may be itchy and may burn.  The exact cause is unknown.  Some types of makeup, moisturizers, dental products, and prescription creams may be partially responsible for the eruption.  Topical steroids such as cortisone creams can temporarily make the rash better but with discontinuation the rash tends to recur and worsen.  If you have been using topical steroids, your dermatologist may need to gradually taper the strength of steroids.  Topical antibiotics, elidel cream, protopic ointment, and oral antiobiotics may be prescribed to treat this condition.  Although perioral dermatitis is not an infection, some antibiotics have anti-inflammatory properties that help it greatly.  

## 2021-05-26 ENCOUNTER — Ambulatory Visit (INDEPENDENT_AMBULATORY_CARE_PROVIDER_SITE_OTHER): Payer: Managed Care, Other (non HMO) | Admitting: Nurse Practitioner

## 2021-05-26 ENCOUNTER — Encounter: Payer: Self-pay | Admitting: Nurse Practitioner

## 2021-05-26 DIAGNOSIS — L710 Perioral dermatitis: Secondary | ICD-10-CM

## 2021-05-26 NOTE — Assessment & Plan Note (Signed)
Improved at this time, recommend continue Metrogel and Claritin for now until all papules have dissipated.  If recurrent flares to return to office and will start Doxycycline for acute flares.

## 2021-05-26 NOTE — Progress Notes (Signed)
BP (!) 92/57   Pulse 64   Temp (!) 97 F (36.1 C) (Oral)   Ht '5\' 2"'  (1.575 m)   Wt 136 lb 12.8 oz (62.1 kg)   SpO2 96%   BMI 25.02 kg/m    Subjective:    Patient ID: Tammy Shaw, female    DOB: 1995-05-04, 26 y.o.   MRN: [id number]  HPI: Tammy Shaw is a 26 y.o. female  Chief Complaint  Patient presents with   Perioral Dermatitis     Patient is here for 10 day follow up on Perioral Dermatitis. Patient states the rash has gotten better and she is doing better.    RASH Follow-up for perioral dermatitis, treated on 05/16/21 with Doxycycline for 10 days and Metrogel + advised to take Claritin daily.  She has completed Doxycycline and continues Metrogel and Claritin daily.  Much improved -- no further rash present. Location: face  Itching: none Burning: none Redness: none Oozing: no Scaling: no Blisters: none Painful: no Fevers: no Change in detergents/soaps/personal care products: no Recent illness: no Recent travel:no History of same: no Context:improved Alleviating factors: Doxycycline and Metrogel Treatments attempted: Benadryl and regular moisturizer, Doxycycline and Metrogel Shortness of breath: no  Throat/tongue swelling: no Myalgias/arthralgias: no   Relevant past medical, surgical, family and social history reviewed and updated as indicated. Interim medical history since our last visit reviewed. Allergies and medications reviewed and updated.  Review of Systems  Constitutional:  Negative for activity change, appetite change, diaphoresis, fatigue and fever.  Respiratory:  Negative for cough, chest tightness and shortness of breath.   Cardiovascular:  Negative for chest pain, palpitations and leg swelling.  Gastrointestinal: Negative.   Skin:  Negative for rash.  Neurological: Negative.   Psychiatric/Behavioral: Negative.     Per HPI unless specifically indicated above     Objective:    BP (!) 92/57   Pulse 64   Temp (!) 97 F (36.1 C)  (Oral)   Ht '5\' 2"'  (1.575 m)   Wt 136 lb 12.8 oz (62.1 kg)   SpO2 96%   BMI 25.02 kg/m   Wt Readings from Last 3 Encounters:  05/26/21 136 lb 12.8 oz (62.1 kg)  05/16/21 134 lb 6.4 oz (61 kg)  12/29/20 136 lb 12.8 oz (62.1 kg)    Physical Exam Vitals and nursing note reviewed.  Constitutional:      General: She is awake. She is not in acute distress.    Appearance: She is well-developed and well-groomed. She is not ill-appearing or toxic-appearing.  HENT:     Head: Normocephalic.     Right Ear: Hearing normal.     Left Ear: Hearing normal.     Nose: Nose normal.     Mouth/Throat:     Mouth: Mucous membranes are moist.  Eyes:     General: Lids are normal.        Right eye: No discharge.        Left eye: No discharge.     Conjunctiva/sclera: Conjunctivae normal.     Pupils: Pupils are equal, round, and reactive to light.  Neck:     Thyroid: No thyromegaly.     Vascular: No carotid bruit.  Cardiovascular:     Rate and Rhythm: Normal rate and regular rhythm.     Heart sounds: Normal heart sounds. No murmur heard.   No gallop.  Pulmonary:     Effort: Pulmonary effort is normal. No accessory muscle usage or respiratory distress.  Breath sounds: Normal breath sounds.  Abdominal:     General: Bowel sounds are normal.     Palpations: Abdomen is soft.  Musculoskeletal:     Cervical back: Normal range of motion and neck supple.     Right lower leg: No edema.     Left lower leg: No edema.  Skin:    General: Skin is warm and dry.     Findings: No rash.     Comments: Much improved at this time, a few small papules with pustule like presentation to left side of mouth, but no further rash noted.  Neurological:     Mental Status: She is alert and oriented to person, place, and time.  Psychiatric:        Attention and Perception: Attention normal.        Mood and Affect: Mood normal.        Speech: Speech normal.        Behavior: Behavior normal. Behavior is cooperative.         Thought Content: Thought content normal.    Results for orders placed or performed in visit on 12/29/20  Pregnancy, urine  Result Value Ref Range   Preg Test, Ur Negative Negative  CBC with Differential/Platelet  Result Value Ref Range   WBC 6.6 3.4 - 10.8 x10E3/uL   RBC 4.47 3.77 - 5.28 x10E6/uL   Hemoglobin 13.3 11.1 - 15.9 g/dL   Hematocrit 39.6 34.0 - 46.6 %   MCV 89 79 - 97 fL   MCH 29.8 26.6 - 33.0 pg   MCHC 33.6 31.5 - 35.7 g/dL   RDW 12.4 11.7 - 15.4 %   Platelets 288 150 - 450 x10E3/uL   Neutrophils 64 Not Estab. %   Lymphs 24 Not Estab. %   Monocytes 8 Not Estab. %   Eos 3 Not Estab. %   Basos 1 Not Estab. %   Neutrophils Absolute 4.2 1.4 - 7.0 x10E3/uL   Lymphocytes Absolute 1.6 0.7 - 3.1 x10E3/uL   Monocytes Absolute 0.5 0.1 - 0.9 x10E3/uL   EOS (ABSOLUTE) 0.2 0.0 - 0.4 x10E3/uL   Basophils Absolute 0.0 0.0 - 0.2 x10E3/uL   Immature Granulocytes 0 Not Estab. %   Immature Grans (Abs) 0.0 0.0 - 0.1 x10E3/uL  Comprehensive metabolic panel  Result Value Ref Range   Glucose 96 70 - 99 mg/dL   BUN 12 6 - 20 mg/dL   Creatinine, Ser 0.73 0.57 - 1.00 mg/dL   eGFR 118 >59 mL/min/1.73   BUN/Creatinine Ratio 16 9 - 23   Sodium 139 134 - 144 mmol/L   Potassium 4.2 3.5 - 5.2 mmol/L   Chloride 102 96 - 106 mmol/L   CO2 22 20 - 29 mmol/L   Calcium 9.3 8.7 - 10.2 mg/dL   Total Protein 7.3 6.0 - 8.5 g/dL   Albumin 4.3 3.9 - 5.0 g/dL   Globulin, Total 3.0 1.5 - 4.5 g/dL   Albumin/Globulin Ratio 1.4 1.2 - 2.2   Bilirubin Total 0.3 0.0 - 1.2 mg/dL   Alkaline Phosphatase 62 44 - 121 IU/L   AST 14 0 - 40 IU/L   ALT 12 0 - 32 IU/L      Assessment & Plan:   Problem List Items Addressed This Visit       Digestive   Perioral dermatitis    Improved at this time, recommend continue Metrogel and Claritin for now until all papules have dissipated.  If recurrent flares to return to office  and will start Doxycycline for acute flares.         Follow up plan: Return if  symptoms worsen or fail to improve.

## 2021-07-17 NOTE — Patient Instructions (Signed)

## 2021-07-19 ENCOUNTER — Encounter: Payer: Self-pay | Admitting: Nurse Practitioner

## 2021-07-19 ENCOUNTER — Ambulatory Visit (INDEPENDENT_AMBULATORY_CARE_PROVIDER_SITE_OTHER): Payer: 59 | Admitting: Nurse Practitioner

## 2021-07-19 VITALS — BP 91/60 | HR 76 | Temp 98.1°F | Ht 63.25 in | Wt 134.6 lb

## 2021-07-19 DIAGNOSIS — Z118 Encounter for screening for other infectious and parasitic diseases: Secondary | ICD-10-CM

## 2021-07-19 DIAGNOSIS — R87622 Low grade squamous intraepithelial lesion on cytologic smear of vagina (LGSIL): Secondary | ICD-10-CM

## 2021-07-19 DIAGNOSIS — Z13220 Encounter for screening for lipoid disorders: Secondary | ICD-10-CM

## 2021-07-19 DIAGNOSIS — Z0000 Encounter for general adult medical examination without abnormal findings: Secondary | ICD-10-CM

## 2021-07-19 DIAGNOSIS — Z1159 Encounter for screening for other viral diseases: Secondary | ICD-10-CM

## 2021-07-19 DIAGNOSIS — Z789 Other specified health status: Secondary | ICD-10-CM

## 2021-07-19 DIAGNOSIS — Z136 Encounter for screening for cardiovascular disorders: Secondary | ICD-10-CM

## 2021-07-19 LAB — PREGNANCY, URINE: Preg Test, Ur: NEGATIVE

## 2021-07-19 NOTE — Progress Notes (Signed)
BP 91/60   Pulse 76   Temp 98.1 F (36.7 C) (Oral)   Ht 5' 3.25" (1.607 m)   Wt 134 lb 9.6 oz (61.1 kg)   SpO2 98%   BMI 23.66 kg/m    Subjective:    Patient ID: Tammy Shaw, female    DOB: January 24, 1995, 26 y.o.   MRN: [id number]  HPI: Tammy Shaw is a 26 y.o. female presenting on 07/19/2021 for comprehensive medical examination. Current medical complaints include:none  She currently lives with: family Menopausal Symptoms: no  Depression Screen done today and results listed below:     07/19/2021    1:32 PM 05/26/2021    9:31 AM 05/16/2021    9:24 AM 12/29/2020    1:49 PM 02/18/2019   11:00 AM  Depression screen PHQ 2/9  Decreased Interest 0 0 0 0 0  Down, Depressed, Hopeless 0 0 0 0 0  PHQ - 2 Score 0 0 0 0 0  Altered sleeping 0 0 0 0 0  Tired, decreased energy 0 0 0 1 0  Change in appetite 0 0 0 0 0  Feeling bad or failure about yourself  0 0 0 0 0  Trouble concentrating 0 0 0 0 0  Moving slowly or fidgety/restless 0 0 0 0 0  Suicidal thoughts 0 0 0 0 0  PHQ-9 Score 0 0 0 1 0  Difficult doing work/chores Not difficult at all  Not difficult at all  Not difficult at all      02/18/2019   11:00 AM 05/16/2021    9:24 AM 05/26/2021    9:31 AM 07/19/2021    1:31 PM 07/19/2021    1:39 PM  Fall Risk  Falls in the past year? 0 0 0 0 0  Was there an injury with Fall? 0 0 0 0 0  Fall Risk Category Calculator 0 0 0 0 0  Fall Risk Category Low Low Low Low Low  Patient Fall Risk Level Low fall risk Low fall risk Low fall risk Low fall risk Low fall risk  Patient at Risk for Falls Due to  No Fall Risks No Fall Risks No Fall Risks No Fall Risks  Fall risk Follow up  Falls evaluation completed Falls evaluation completed Falls evaluation completed Falls evaluation completed     Functional Status Survey: Is the patient deaf or have difficulty hearing?: No Does the patient have difficulty seeing, even when wearing glasses/contacts?: No Does the patient have difficulty  concentrating, remembering, or making decisions?: No Does the patient have difficulty walking or climbing stairs?: No Does the patient have difficulty dressing or bathing?: No Does the patient have difficulty doing errands alone such as visiting a doctor's office or shopping?: No    Past Medical History:  History reviewed. No pertinent past medical history.  Surgical History:  History reviewed. No pertinent surgical history.  Medications:  Current Outpatient Medications on File Prior to Visit  Medication Sig   Norethindrone Acetate-Ethinyl Estradiol (JUNEL 1.5/30) 1.5-30 MG-MCG tablet Take 1 tablet by mouth daily.   metroNIDAZOLE (METROGEL) 0.75 % gel Apply 1 application. topically daily. For 8 weeks, then stop. (Patient not taking: Reported on 07/19/2021)   No current facility-administered medications on file prior to visit.    Allergies:  No Known Allergies  Social History:  Social History   Socioeconomic History   Marital status: Single    Spouse name: Not on file   Number of children: Not on file  Years of education: Not on file   Highest education level: Not on file  Occupational History   Not on file  Tobacco Use   Smoking status: Never   Smokeless tobacco: Never  Vaping Use   Vaping Use: Never used  Substance and Sexual Activity   Alcohol use: No   Drug use: No   Sexual activity: Yes  Other Topics Concern   Not on file  Social History Narrative   Not on file   Social Determinants of Health   Financial Resource Strain: Not on file  Food Insecurity: Not on file  Transportation Needs: Not on file  Physical Activity: Not on file  Stress: Not on file  Social Connections: Not on file  Intimate Partner Violence: Not on file   Social History   Tobacco Use  Smoking Status Never  Smokeless Tobacco Never   Social History   Substance and Sexual Activity  Alcohol Use No    Family History:  Family History  Problem Relation Age of Onset   Epilepsy  Paternal Grandmother     Past medical history, surgical history, medications, allergies, family history and social history reviewed with patient today and changes made to appropriate areas of the chart.   ROS All other ROS negative except what is listed above and in the HPI.      Objective:    BP 91/60   Pulse 76   Temp 98.1 F (36.7 C) (Oral)   Ht 5' 3.25" (1.607 m)   Wt 134 lb 9.6 oz (61.1 kg)   SpO2 98%   BMI 23.66 kg/m   Wt Readings from Last 3 Encounters:  07/19/21 134 lb 9.6 oz (61.1 kg)  05/26/21 136 lb 12.8 oz (62.1 kg)  05/16/21 134 lb 6.4 oz (61 kg)    Physical Exam  Results for orders placed or performed in visit on 07/19/21  Pregnancy, urine  Result Value Ref Range   Preg Test, Ur Negative Negative      Assessment & Plan:   Problem List Items Addressed This Visit       Other   LGSIL Pap smear of vagina    Refuses repeat pap today, agrees to return in fall and will obtain then.      Uses birth control - Primary    Ongoing, no acute concerns today.  Urine pregnancy testing today.  Check CBC and CMP today.  Return in one year.  Refills sent in.      Relevant Orders   Pregnancy, urine (Completed)   Comprehensive metabolic panel   Other Visit Diagnoses     Need for hepatitis C screening test       Hep C screen on labs today per guidelines for one time screening, discussed with patient.   Relevant Orders   Hepatitis C antibody   Screening for chlamydial disease       Chlamydia screen on labs today per guidelines, discussed with patient.   Relevant Orders   GC/Chlamydia Probe Amp   Encounter for lipid screening for cardiovascular disease       Lipid panel on labs today.   Relevant Orders   Lipid Panel w/o Chol/HDL Ratio   Encounter for annual physical exam       Annual physical with labs today and health maintenance reviewed with patient.   Relevant Orders   CBC with Differential/Platelet   TSH        Follow up plan: Return in about 4  months (around 11/23/2021)  for Pap smear only.   LABORATORY TESTING:  - Pap smear: wishes to have done in fall  IMMUNIZATIONS:   - Tdap: Tetanus vaccination status reviewed: last tetanus booster within 10 years. - Influenza: Up to date - Pneumovax: Not applicable - Prevnar: Not applicable - COVID: Up to date - HPV: Up to date - Shingrix vaccine: Not applicable  SCREENING: -Mammogram: Not applicable  - Colonoscopy: Not applicable  - Bone Density: Not applicable  -Hearing Test: Not applicable  -Spirometry: Not applicable   PATIENT COUNSELING:   Advised to take 1 mg of folate supplement per day if capable of pregnancy.   Sexuality: Discussed sexually transmitted diseases, partner selection, use of condoms, avoidance of unintended pregnancy  and contraceptive alternatives.   Advised to avoid cigarette smoking.  I discussed with the patient that most people either abstain from alcohol or drink within safe limits (<=14/week and <=4 drinks/occasion for males, <=7/weeks and <= 3 drinks/occasion for females) and that the risk for alcohol disorders and other health effects rises proportionally with the number of drinks per week and how often a drinker exceeds daily limits.  Discussed cessation/primary prevention of drug use and availability of treatment for abuse.   Diet: Encouraged to adjust caloric intake to maintain  or achieve ideal body weight, to reduce intake of dietary saturated fat and total fat, to limit sodium intake by avoiding high sodium foods and not adding table salt, and to maintain adequate dietary potassium and calcium preferably from fresh fruits, vegetables, and low-fat dairy products.    Stressed the importance of regular exercise  Injury prevention: Discussed safety belts, safety helmets, smoke detector, smoking near bedding or upholstery.   Dental health: Discussed importance of regular tooth brushing, flossing, and dental visits.    NEXT PREVENTATIVE PHYSICAL  DUE IN 1 YEAR. Return in about 4 months (around 11/23/2021) for Pap smear only.

## 2021-07-19 NOTE — Assessment & Plan Note (Signed)
Refuses repeat pap today, agrees to return in fall and will obtain then.

## 2021-07-19 NOTE — Assessment & Plan Note (Signed)
Ongoing, no acute concerns today.  Urine pregnancy testing today.  Check CBC and CMP today.  Return in one year.  Refills sent in.

## 2021-07-20 LAB — LIPID PANEL W/O CHOL/HDL RATIO
Cholesterol, Total: 186 mg/dL (ref 100–199)
HDL: 49 mg/dL (ref >39)
LDL Chol Calc (NIH): 119 mg/dL — ABNORMAL HIGH (ref 0–99)
Triglycerides: 98 mg/dL (ref 0–149)
VLDL Cholesterol Cal: 18 mg/dL (ref 5–40)

## 2021-07-20 LAB — COMPREHENSIVE METABOLIC PANEL
ALT: 13 IU/L (ref 0–32)
AST: 15 IU/L (ref 0–40)
Albumin/Globulin Ratio: 1.8 (ref 1.2–2.2)
Albumin: 4.6 g/dL (ref 4.0–5.0)
Alkaline Phosphatase: 65 IU/L (ref 44–121)
BUN/Creatinine Ratio: 13 (ref 9–23)
BUN: 11 mg/dL (ref 6–20)
Bilirubin Total: 0.3 mg/dL (ref 0.0–1.2)
CO2: 24 mmol/L (ref 20–29)
Calcium: 9.5 mg/dL (ref 8.7–10.2)
Chloride: 103 mmol/L (ref 96–106)
Creatinine, Ser: 0.88 mg/dL (ref 0.57–1.00)
Globulin, Total: 2.5 g/dL (ref 1.5–4.5)
Glucose: 109 mg/dL — ABNORMAL HIGH (ref 70–99)
Potassium: 4.2 mmol/L (ref 3.5–5.2)
Sodium: 138 mmol/L (ref 134–144)
Total Protein: 7.1 g/dL (ref 6.0–8.5)
eGFR: 94 mL/min/{1.73_m2} (ref >59)

## 2021-07-20 LAB — CBC WITH DIFFERENTIAL/PLATELET
Basophils Absolute: 0.1 10*3/uL (ref 0.0–0.2)
Basos: 1 %
EOS (ABSOLUTE): 0.3 10*3/uL (ref 0.0–0.4)
Eos: 4 %
Hematocrit: 38.8 % (ref 34.0–46.6)
Hemoglobin: 12.9 g/dL (ref 11.1–15.9)
Immature Grans (Abs): 0 10*3/uL (ref 0.0–0.1)
Immature Granulocytes: 0 %
Lymphocytes Absolute: 1.6 10*3/uL (ref 0.7–3.1)
Lymphs: 22 %
MCH: 29.6 pg (ref 26.6–33.0)
MCHC: 33.2 g/dL (ref 31.5–35.7)
MCV: 89 fL (ref 79–97)
Monocytes Absolute: 0.4 10*3/uL (ref 0.1–0.9)
Monocytes: 6 %
Neutrophils Absolute: 4.9 10*3/uL (ref 1.4–7.0)
Neutrophils: 67 %
Platelets: 266 10*3/uL (ref 150–450)
RBC: 4.36 x10E6/uL (ref 3.77–5.28)
RDW: 12.1 % (ref 11.7–15.4)
WBC: 7.2 10*3/uL (ref 3.4–10.8)

## 2021-07-20 LAB — HEPATITIS C ANTIBODY: Hep C Virus Ab: NONREACTIVE

## 2021-07-20 LAB — TSH: TSH: 1.33 u[IU]/mL (ref 0.450–4.500)

## 2021-07-20 NOTE — Progress Notes (Signed)
Contacted via MyChart   Good morning Niema, your labs have returned and overall remain stable with exception of LDL.  Your LDL is above normal. The LDL is the bad cholesterol. Over time and in combination with inflammation and other factors, this contributes to plaque which in turn may lead to stroke and/or heart attack down the road. Sometimes high LDL is primarily genetic, and people might be eating all the right foods but still have high numbers. Other times, there is room for improvement in one's diet and eating healthier can bring this number down and potentially reduce one's risk of heart attack and/or stroke.   To reduce your LDL, Remember - more fruits and vegetables, more fish, and limit red meat and dairy products. More soy, nuts, beans, barley, lentils, oats and plant sterol ester enriched margarine instead of butter. I also encourage eliminating sugar and processed food. Remember, shop on the outside of the grocery store and visit your [employer]. If you would like to talk with me about dietary changes for your cholesterol, please let me know. We should recheck your cholesterol in 12 months. Any questions? Keep being amazing!!  Thank you for allowing me to participate in your care.  I appreciate you. Kindest regards, Joanthan Hlavacek

## 2021-07-21 LAB — GC/CHLAMYDIA PROBE AMP
Chlamydia trachomatis, NAA: NEGATIVE
Neisseria Gonorrhoeae by PCR: NEGATIVE

## 2021-07-21 NOTE — Progress Notes (Signed)
Contacted via MyChart   Good morning Tammy Shaw, your urine testing returned negative for gonorrhea and chlamydia.

## 2021-09-19 ENCOUNTER — Ambulatory Visit: Payer: 59 | Admitting: Nurse Practitioner

## 2021-11-23 ENCOUNTER — Ambulatory Visit: Payer: 59 | Admitting: Nurse Practitioner

## 2021-11-25 ENCOUNTER — Other Ambulatory Visit: Payer: Self-pay | Admitting: Nurse Practitioner

## 2021-11-25 DIAGNOSIS — N921 Excessive and frequent menstruation with irregular cycle: Secondary | ICD-10-CM

## 2021-11-25 NOTE — Telephone Encounter (Signed)
Requested Prescriptions  Pending Prescriptions Disp Refills   HAILEY 1.5/30 1.5-30 MG-MCG tablet [Pharmacy Med Name: HAILEY 1.5/30 TABLETS 21] 84 tablet 2    Sig: TAKE 1 TABLET BY MOUTH EVERY DAY     OB/GYN:  Contraceptives Passed - 11/25/2021 10:12 AM      Passed - Last BP in normal range    BP Readings from Last 1 Encounters:  07/19/21 91/60         Passed - Valid encounter within last 12 months    Recent Outpatient Visits           4 months ago Uses birth control   [facility] [place], [place] T, NP   6 months ago Perioral dermatitis   [facility] [place], [place] T, NP   6 months ago Perioral dermatitis   [facility] [place], [place] T, NP   11 months ago Uses birth control   [facility] [place], [place] T, NP   2 years ago Well woman exam   [facility] [place], [place], DO              Passed - Patient is not a smoker

## 2021-12-20 ENCOUNTER — Encounter: Payer: Self-pay | Admitting: Nurse Practitioner

## 2021-12-21 ENCOUNTER — Telehealth: Payer: Self-pay | Admitting: Nurse Practitioner

## 2021-12-21 NOTE — Telephone Encounter (Signed)
Paperwork placed in signature file for signature

## 2021-12-21 NOTE — Telephone Encounter (Signed)
PT was communicating with provider regarding filling out her Health Examination Certificate. Was told to drop the paper office and provider will fill out.  Placed in provider's folder.

## 2021-12-22 NOTE — Telephone Encounter (Signed)
Paperwork has been filled out and ready to be picked up. Please call

## 2022-01-03 ENCOUNTER — Encounter: Payer: 59 | Admitting: Nurse Practitioner

## 2022-02-08 NOTE — Telephone Encounter (Signed)
Called pt to let her know she can pick her Health Examination Certificate paperwork.  Left voicemail for her to call office back and put a CRM in so if she called back this information could be relayed.

## 2022-09-01 ENCOUNTER — Ambulatory Visit: Payer: Managed Care, Other (non HMO) | Admitting: Nurse Practitioner

## 2022-09-03 DIAGNOSIS — E7800 Pure hypercholesterolemia, unspecified: Secondary | ICD-10-CM | POA: Insufficient documentation

## 2022-09-03 NOTE — Patient Instructions (Signed)

## 2022-09-08 ENCOUNTER — Ambulatory Visit (INDEPENDENT_AMBULATORY_CARE_PROVIDER_SITE_OTHER): Payer: Managed Care, Other (non HMO) | Admitting: Nurse Practitioner

## 2022-09-08 ENCOUNTER — Encounter: Payer: Self-pay | Admitting: Nurse Practitioner

## 2022-09-08 ENCOUNTER — Other Ambulatory Visit (HOSPITAL_COMMUNITY)
Admission: RE | Admit: 2022-09-08 | Discharge: 2022-09-08 | Disposition: A | Discharge status: Home / Self Care | Payer: Managed Care, Other (non HMO) | Source: Ambulatory Visit | Attending: Nurse Practitioner | Admitting: Nurse Practitioner

## 2022-09-08 VITALS — BP 106/65 | HR 80 | Temp 98.3°F | Ht 62.1 in | Wt 128.4 lb

## 2022-09-08 DIAGNOSIS — R87622 Low grade squamous intraepithelial lesion on cytologic smear of vagina (LGSIL): Secondary | ICD-10-CM | POA: Insufficient documentation

## 2022-09-08 DIAGNOSIS — Z0000 Encounter for general adult medical examination without abnormal findings: Secondary | ICD-10-CM | POA: Diagnosis not present

## 2022-09-08 DIAGNOSIS — E7800 Pure hypercholesterolemia, unspecified: Secondary | ICD-10-CM

## 2022-09-08 DIAGNOSIS — Z789 Other specified health status: Secondary | ICD-10-CM

## 2022-09-08 LAB — PREGNANCY, URINE: Preg Test, Ur: NEGATIVE

## 2022-09-08 MED ORDER — NORETHINDRONE ACET-ETHINYL EST 1.5-30 MG-MCG PO TABS
1.0000 | ORAL_TABLET | Freq: Every day | ORAL | 4 refills | Status: DC
Start: 2022-09-08 — End: 2023-09-11

## 2022-09-08 NOTE — Assessment & Plan Note (Signed)
Ongoing, no acute concerns today.  Urine pregnancy testing today.  Check CBC and CMP today.  Return in one year.  Refills sent in.

## 2022-09-08 NOTE — Assessment & Plan Note (Signed)
Noted on past labs, recommend heavy focus on diet changes and regular exercise.  Labs today.

## 2022-09-08 NOTE — Progress Notes (Signed)
BP 106/65   Pulse 80   Temp 98.3 F (36.8 C) (Oral)   Ht 5' 2.1" (1.577 m)   Wt 128 lb 6.4 oz (58.2 kg)   LMP 08/30/2022 (Approximate)   SpO2 97%   BMI 23.41 kg/m    Subjective:    Patient ID: Tammy Shaw, female    DOB: 1995-07-03, 27 y.o.   MRN: [id number]  HPI: Tangela Uccello is a 27 y.o. female presenting on 09/08/2022 for comprehensive medical examination. Current medical complaints include:none  She currently lives with: self Menopausal Symptoms: no  Depression Screen done today and results listed below:     09/08/2022    2:35 PM 07/19/2021    1:32 PM 05/26/2021    9:31 AM 05/16/2021    9:24 AM 12/29/2020    1:49 PM  Depression screen PHQ 2/9  Decreased Interest 0 0 0 0 0  Down, Depressed, Hopeless 0 0 0 0 0  PHQ - 2 Score 0 0 0 0 0  Altered sleeping 0 0 0 0 0  Tired, decreased energy 0 0 0 0 1  Change in appetite 0 0 0 0 0  Feeling bad or failure about yourself  0 0 0 0 0  Trouble concentrating 0 0 0 0 0  Moving slowly or fidgety/restless 0 0 0 0 0  Suicidal thoughts 0 0 0 0 0  PHQ-9 Score 0 0 0 0 1  Difficult doing work/chores Not difficult at all Not difficult at all  Not difficult at all       09/08/2022    2:35 PM 07/19/2021    1:32 PM 05/26/2021    9:31 AM 05/16/2021    9:24 AM  GAD 7 : Generalized Anxiety Score  Nervous, Anxious, on Edge 0 0 0 0  Control/stop worrying 0 0 0 0  Worry too much - different things 0 0 0 0  Trouble relaxing 0 0 0 0  Restless 0 0 0 0  Easily annoyed or irritable 0 0 0 0  Afraid - awful might happen 0 0 0 0  Total GAD 7 Score 0 0 0 0  Anxiety Difficulty Not difficult at all Not difficult at all  Not difficult at all      05/16/2021    9:24 AM 05/26/2021    9:31 AM 07/19/2021    1:31 PM 07/19/2021    1:39 PM 09/08/2022    2:35 PM  Fall Risk  Falls in the past year? 0 0 0 0 0  Was there an injury with Fall? 0 0 0 0 0  Fall Risk Category Calculator 0 0 0 0 0  Fall Risk Category (Retired) Low Low Low Low    (RETIRED) Patient Fall Risk Level Low fall risk Low fall risk Low fall risk Low fall risk   Patient at Risk for Falls Due to No Fall Risks No Fall Risks No Fall Risks No Fall Risks No Fall Risks  Fall risk Follow up Falls evaluation completed Falls evaluation completed Falls evaluation completed Falls evaluation completed Falls evaluation completed    Past Medical History:  History reviewed. No pertinent past medical history.  Surgical History:  History reviewed. No pertinent surgical history.  Medications:  No current outpatient medications on file prior to visit.   No current facility-administered medications on file prior to visit.    Allergies:  No Known Allergies  Social History:  Social History   Socioeconomic History   Marital status: Single  Spouse name: Not on file   Number of children: Not on file   Years of education: Not on file   Highest education level: Bachelor's degree (e.g., BA, AB, BS)  Occupational History   Not on file  Tobacco Use   Smoking status: Never   Smokeless tobacco: Never  Vaping Use   Vaping status: Never Used  Substance and Sexual Activity   Alcohol use: No   Drug use: No   Sexual activity: Yes  Other Topics Concern   Not on file  Social History Narrative   Not on file   Social Determinants of Health   Financial Resource Strain: Low Risk  (09/07/2022)   Overall Financial Resource Strain (CARDIA)    Difficulty of Paying Living Expenses: Not hard at all  Food Insecurity: No Food Insecurity (09/07/2022)   Hunger Vital Sign    Worried About Running Out of Food in the Last Year: Never true    Ran Out of Food in the Last Year: Never true  Transportation Needs: No Transportation Needs (09/07/2022)   PRAPARE - Administrator, Civil Service (Medical): No    Lack of Transportation (Non-Medical): No  Physical Activity: Insufficiently Active (09/07/2022)   Exercise Vital Sign    Days of Exercise per Week: 3 days    Minutes of  Exercise per Session: 40 min  Stress: No Stress Concern Present (09/07/2022)   [employer] of Occupational Health - Occupational Stress Questionnaire    Feeling of Stress : Not at all  Social Connections: Socially Isolated (09/07/2022)   Social Connection and Isolation Panel [NHANES]    Frequency of Communication with Friends and Family: More than three times a week    Frequency of Social Gatherings with Friends and Family: More than three times a week    Attends Religious Services: Never    Database administrator or Organizations: No    Attends Engineer, structural: Not on file    Marital Status: Never married  Catering manager Violence: Not on file   Social History   Tobacco Use  Smoking Status Never  Smokeless Tobacco Never   Social History   Substance and Sexual Activity  Alcohol Use No    Family History:  Family History  Problem Relation Age of Onset   Epilepsy Paternal Grandmother     Past medical history, surgical history, medications, allergies, family history and social history reviewed with patient today and changes made to appropriate areas of the chart.   ROS All other ROS negative except what is listed above and in the HPI.      Objective:    BP 106/65   Pulse 80   Temp 98.3 F (36.8 C) (Oral)   Ht 5' 2.1" (1.577 m)   Wt 128 lb 6.4 oz (58.2 kg)   LMP 08/30/2022 (Approximate)   SpO2 97%   BMI 23.41 kg/m   Wt Readings from Last 3 Encounters:  09/08/22 128 lb 6.4 oz (58.2 kg)  07/19/21 134 lb 9.6 oz (61.1 kg)  05/26/21 136 lb 12.8 oz (62.1 kg)    Physical Exam Vitals and nursing note reviewed. Exam conducted with a chaperone present.  Constitutional:      General: She is awake. She is not in acute distress.    Appearance: She is well-developed and well-groomed. She is not ill-appearing or toxic-appearing.  HENT:     Head: Normocephalic and atraumatic.     Right Ear: Hearing, tympanic membrane, ear canal and  external ear normal. No  drainage.     Left Ear: Hearing, tympanic membrane, ear canal and external ear normal. No drainage.     Nose: Nose normal.     Right Sinus: No maxillary sinus tenderness or frontal sinus tenderness.     Left Sinus: No maxillary sinus tenderness or frontal sinus tenderness.     Mouth/Throat:     Mouth: Mucous membranes are moist.     Pharynx: Oropharynx is clear. Uvula midline. No pharyngeal swelling, oropharyngeal exudate or posterior oropharyngeal erythema.  Eyes:     General: Lids are normal.        Right eye: No discharge.        Left eye: No discharge.     Extraocular Movements: Extraocular movements intact.     Conjunctiva/sclera: Conjunctivae normal.     Pupils: Pupils are equal, round, and reactive to light.     Visual Fields: Right eye visual fields normal and left eye visual fields normal.  Neck:     Thyroid: No thyromegaly.     Vascular: No carotid bruit.     Trachea: Trachea normal.  Cardiovascular:     Rate and Rhythm: Normal rate and regular rhythm.     Heart sounds: Normal heart sounds. No murmur heard.    No gallop.  Pulmonary:     Effort: Pulmonary effort is normal. No accessory muscle usage or respiratory distress.     Breath sounds: Normal breath sounds.  Chest:  Breasts:    Right: Normal.     Left: Normal.  Abdominal:     General: Bowel sounds are normal.     Palpations: Abdomen is soft. There is no hepatomegaly or splenomegaly.     Tenderness: There is no abdominal tenderness.     Hernia: There is no hernia in the left inguinal area or right inguinal area.  Genitourinary:    Exam position: Lithotomy position.     Labia:        Right: No rash.        Left: No rash.      Urethra: No prolapse.     Vagina: Normal.     Cervix: Normal.     Uterus: Normal.      Adnexa: Right adnexa normal and left adnexa normal.     Comments: Cervix anterior midline, pap obtained and sent to lab. Musculoskeletal:        General: Normal range of motion.     Cervical back:  Normal range of motion and neck supple.     Right lower leg: No edema.     Left lower leg: No edema.  Lymphadenopathy:     Head:     Right side of head: No submental, submandibular, tonsillar, preauricular or posterior auricular adenopathy.     Left side of head: No submental, submandibular, tonsillar, preauricular or posterior auricular adenopathy.     Cervical: No cervical adenopathy.     Upper Body:     Right upper body: No supraclavicular, axillary or pectoral adenopathy.     Left upper body: No supraclavicular, axillary or pectoral adenopathy.  Skin:    General: Skin is warm and dry.     Capillary Refill: Capillary refill takes less than 2 seconds.     Findings: No rash.  Neurological:     Mental Status: She is alert and oriented to person, place, and time.     Gait: Gait is intact.     Deep Tendon Reflexes: Reflexes are normal and  symmetric.     Reflex Scores:      Brachioradialis reflexes are 2+ on the right side and 2+ on the left side.      Patellar reflexes are 2+ on the right side and 2+ on the left side. Psychiatric:        Attention and Perception: Attention normal.        Mood and Affect: Mood normal.        Speech: Speech normal.        Behavior: Behavior normal. Behavior is cooperative.        Thought Content: Thought content normal.        Judgment: Judgment normal.     Results for orders placed or performed in visit on 07/19/21  GC/Chlamydia Probe Amp   Specimen: Urine   UR  Result Value Ref Range   Chlamydia trachomatis, NAA Negative Negative   Neisseria Gonorrhoeae by PCR Negative Negative  Pregnancy, urine  Result Value Ref Range   Preg Test, Ur Negative Negative  CBC with Differential/Platelet  Result Value Ref Range   WBC 7.2 3.4 - 10.8 x10E3/uL   RBC 4.36 3.77 - 5.28 x10E6/uL   Hemoglobin 12.9 11.1 - 15.9 g/dL   Hematocrit 27.2 53.6 - 46.6 %   MCV 89 79 - 97 fL   MCH 29.6 26.6 - 33.0 pg   MCHC 33.2 31.5 - 35.7 g/dL   RDW 64.4 03.4 - 74.2 %    Platelets 266 150 - 450 x10E3/uL   Neutrophils 67 Not Estab. %   Lymphs 22 Not Estab. %   Monocytes 6 Not Estab. %   Eos 4 Not Estab. %   Basos 1 Not Estab. %   Neutrophils Absolute 4.9 1.4 - 7.0 x10E3/uL   Lymphocytes Absolute 1.6 0.7 - 3.1 x10E3/uL   Monocytes Absolute 0.4 0.1 - 0.9 x10E3/uL   EOS (ABSOLUTE) 0.3 0.0 - 0.4 x10E3/uL   Basophils Absolute 0.1 0.0 - 0.2 x10E3/uL   Immature Granulocytes 0 Not Estab. %   Immature Grans (Abs) 0.0 0.0 - 0.1 x10E3/uL  Comprehensive metabolic panel  Result Value Ref Range   Glucose 109 (H) 70 - 99 mg/dL   BUN 11 6 - 20 mg/dL   Creatinine, Ser 5.95 0.57 - 1.00 mg/dL   eGFR 94 >63 OV/FIE/3.32   BUN/Creatinine Ratio 13 9 - 23   Sodium 138 134 - 144 mmol/L   Potassium 4.2 3.5 - 5.2 mmol/L   Chloride 103 96 - 106 mmol/L   CO2 24 20 - 29 mmol/L   Calcium 9.5 8.7 - 10.2 mg/dL   Total Protein 7.1 6.0 - 8.5 g/dL   Albumin 4.6 4.0 - 5.0 g/dL   Globulin, Total 2.5 1.5 - 4.5 g/dL   Albumin/Globulin Ratio 1.8 1.2 - 2.2   Bilirubin Total 0.3 0.0 - 1.2 mg/dL   Alkaline Phosphatase 65 44 - 121 IU/L   AST 15 0 - 40 IU/L   ALT 13 0 - 32 IU/L  Lipid Panel w/o Chol/HDL Ratio  Result Value Ref Range   Cholesterol, Total 186 100 - 199 mg/dL   Triglycerides 98 0 - 149 mg/dL   HDL 49 >95 mg/dL   VLDL Cholesterol Cal 18 5 - 40 mg/dL   LDL Chol Calc (NIH) 188 (H) 0 - 99 mg/dL  TSH  Result Value Ref Range   TSH 1.330 0.450 - 4.500 uIU/mL  Hepatitis C antibody  Result Value Ref Range   Hep C Virus Ab Non Reactive  Non Reactive      Assessment & Plan:   Problem List Items Addressed This Visit       Other   Elevated low density lipoprotein (LDL) cholesterol level - Primary    Noted on past labs, recommend heavy focus on diet changes and regular exercise.  Labs today.      Relevant Orders   Comprehensive metabolic panel   Lipid Panel w/o Chol/HDL Ratio   TSH   LGSIL Pap smear of vagina    Pap obtained today and sent to lab for recheck.       Relevant Orders   Cytology - PAP   Uses birth control    Ongoing, no acute concerns today.  Urine pregnancy testing today.  Check CBC and CMP today.  Return in one year.  Refills sent in.      Relevant Medications   Norethindrone Acetate-Ethinyl Estradiol (HAILEY 1.5/30) 1.5-30 MG-MCG tablet   Other Relevant Orders   Comprehensive metabolic panel   CBC with Differential/Platelet   Lipid Panel w/o Chol/HDL Ratio   Pregnancy, urine   Other Visit Diagnoses     Encounter for annual physical exam       Annual physical today with labs and health maintenance reviewed, discussed with patient.        Follow up plan: Return in about 1 year (around 09/08/2023) for Annual Exam.   LABORATORY TESTING:  - Pap smear: pap done  IMMUNIZATIONS:   - Tdap: Tetanus vaccination status reviewed: last tetanus booster within 10 years. - Influenza: Up to date - Pneumovax: Not applicable - Prevnar: Not applicable - COVID: Up to date - HPV: Up to date - Shingrix vaccine: Not applicable  SCREENING: -Mammogram: Not applicable  - Colonoscopy: Not applicable  - Bone Density: Not applicable  -Hearing Test: Not applicable  -Spirometry: Not applicable   PATIENT COUNSELING:   Advised to take 1 mg of folate supplement per day if capable of pregnancy.   Sexuality: Discussed sexually transmitted diseases, partner selection, use of condoms, avoidance of unintended pregnancy  and contraceptive alternatives.   Advised to avoid cigarette smoking.  I discussed with the patient that most people either abstain from alcohol or drink within safe limits (<=14/week and <=4 drinks/occasion for males, <=7/weeks and <= 3 drinks/occasion for females) and that the risk for alcohol disorders and other health effects rises proportionally with the number of drinks per week and how often a drinker exceeds daily limits.  Discussed cessation/primary prevention of drug use and availability of treatment for abuse.   Diet:  Encouraged to adjust caloric intake to maintain  or achieve ideal body weight, to reduce intake of dietary saturated fat and total fat, to limit sodium intake by avoiding high sodium foods and not adding table salt, and to maintain adequate dietary potassium and calcium preferably from fresh fruits, vegetables, and low-fat dairy products.    Stressed the importance of regular exercise  Injury prevention: Discussed safety belts, safety helmets, smoke detector, smoking near bedding or upholstery.   Dental health: Discussed importance of regular tooth brushing, flossing, and dental visits.    NEXT PREVENTATIVE PHYSICAL DUE IN 1 YEAR. Return in about 1 year (around 09/08/2023) for Annual Exam.

## 2022-09-08 NOTE — Assessment & Plan Note (Signed)
Pap obtained today and sent to lab for recheck.

## 2022-09-09 LAB — COMPREHENSIVE METABOLIC PANEL
ALT: 18 IU/L (ref 0–32)
AST: 21 IU/L (ref 0–40)
Albumin: 4.6 g/dL (ref 4.0–5.0)
Alkaline Phosphatase: 87 IU/L (ref 44–121)
BUN/Creatinine Ratio: 17 (ref 9–23)
BUN: 12 mg/dL (ref 6–20)
Bilirubin Total: <0.2 mg/dL (ref 0.0–1.2)
CO2: 22 mmol/L (ref 20–29)
Calcium: 9.6 mg/dL (ref 8.7–10.2)
Chloride: 102 mmol/L (ref 96–106)
Creatinine, Ser: 0.71 mg/dL (ref 0.57–1.00)
Globulin, Total: 3 g/dL (ref 1.5–4.5)
Glucose: 91 mg/dL (ref 70–99)
Potassium: 3.9 mmol/L (ref 3.5–5.2)
Sodium: 140 mmol/L (ref 134–144)
Total Protein: 7.6 g/dL (ref 6.0–8.5)
eGFR: 121 mL/min/{1.73_m2} (ref >59)

## 2022-09-09 LAB — CBC WITH DIFFERENTIAL/PLATELET
Basophils Absolute: 0 10*3/uL (ref 0.0–0.2)
Basos: 0 %
EOS (ABSOLUTE): 0.2 10*3/uL (ref 0.0–0.4)
Eos: 3 %
Hematocrit: 42.9 % (ref 34.0–46.6)
Hemoglobin: 13.7 g/dL (ref 11.1–15.9)
Immature Grans (Abs): 0 10*3/uL (ref 0.0–0.1)
Immature Granulocytes: 0 %
Lymphocytes Absolute: 1.6 10*3/uL (ref 0.7–3.1)
Lymphs: 26 %
MCH: 29.2 pg (ref 26.6–33.0)
MCHC: 31.9 g/dL (ref 31.5–35.7)
MCV: 92 fL (ref 79–97)
Monocytes Absolute: 0.4 10*3/uL (ref 0.1–0.9)
Monocytes: 7 %
Neutrophils Absolute: 4 10*3/uL (ref 1.4–7.0)
Neutrophils: 64 %
Platelets: 311 10*3/uL (ref 150–450)
RBC: 4.69 x10E6/uL (ref 3.77–5.28)
RDW: 12.2 % (ref 11.7–15.4)
WBC: 6.3 10*3/uL (ref 3.4–10.8)

## 2022-09-09 LAB — TSH: TSH: 1.28 u[IU]/mL (ref 0.450–4.500)

## 2022-09-09 LAB — LIPID PANEL W/O CHOL/HDL RATIO
Cholesterol, Total: 233 mg/dL — ABNORMAL HIGH (ref 100–199)
HDL: 67 mg/dL (ref >39)
LDL Chol Calc (NIH): 149 mg/dL — ABNORMAL HIGH (ref 0–99)
Triglycerides: 96 mg/dL (ref 0–149)
VLDL Cholesterol Cal: 17 mg/dL (ref 5–40)

## 2022-09-10 NOTE — Progress Notes (Signed)
Contacted via MyChart   Good morning Tammy Shaw, your labs have returned and are overall normal with exception of cholesterol levels.  Continue focus on healthy diet and regular exercise.  Any questions?  Pap results have not returned yet:) Keep being amazing!!  Thank you for allowing me to participate in your care.  I appreciate you. Kindest regards, Gera Inboden

## 2022-09-14 ENCOUNTER — Other Ambulatory Visit: Payer: Self-pay | Admitting: Nurse Practitioner

## 2022-09-14 LAB — CYTOLOGY - PAP
Adequacy: ABSENT
Diagnosis: NEGATIVE

## 2022-09-14 MED ORDER — METRONIDAZOLE 500 MG PO TABS: 500.0000 mg | ORAL_TABLET | Freq: Two times a day (BID) | ORAL | 0 refills | Status: AC

## 2022-09-14 NOTE — Progress Notes (Signed)
Contacted via MyChart   Good afternoon Tammy Shaw, your pap has returned and show no cancerous findings.  Good news.  There is some bacterial vaginosis present.  This is not sexually transmitted, but is instead just a build up of our normal flora in the vaginal area.  We treat this with Flagyl twice a day for 7 days.  I will send this in.  Take it with food to help prevent nausea and no alcohol use while taking.  Any questions?  Repeat pap in 3 years:)

## 2023-08-24 ENCOUNTER — Telehealth: Payer: Self-pay | Admitting: Nurse Practitioner

## 2023-08-24 NOTE — Telephone Encounter (Signed)
 Patient dropped off Vaccination Verification Form to be filled out by provider. Patient is requesting a call back at [phone] within 2-5 days when completed. Document is located in providers folder.

## 2023-08-27 NOTE — Telephone Encounter (Signed)
 Paperwork received and started. Given to provider to review and sign.

## 2023-08-29 NOTE — Telephone Encounter (Signed)
 Paperwork completed and signed. Copy placed in scan bin and original placed in bin for patient to pick up. Called and notified patient that forms were ready to be picked up.

## 2023-09-09 NOTE — Patient Instructions (Incomplete)
 Be Involved in Caring For Your Health:  Taking Medications When medications are taken as directed, they can greatly improve your health. But if they are not taken as prescribed, they may not work. In some cases, not taking them correctly can be harmful. To help ensure your treatment remains effective and safe, understand your medications and how to take them. Bring your medications to each visit for review by your provider.  Your lab results, notes, and after visit summary will be available on My Chart. We strongly encourage you to use this feature. If lab results are abnormal the clinic will contact you with the appropriate steps. If the clinic does not contact you assume the results are satisfactory. You can always view your results on My Chart. If you have questions regarding your health or results, please contact the clinic during office hours. You can also ask questions on My Chart.  We at [facility] are grateful that you chose us  to provide your care. We strive to provide evidence-based and compassionate care and are always looking for feedback. If you get a survey from the clinic please complete this so we can hear your opinions.  Healthy Eating, Adult Healthy eating may help you get and keep a healthy body weight, reduce the risk of chronic disease, and live a long and productive life. It is important to follow a healthy eating pattern. Your nutritional and calorie needs should be met mainly by different nutrient-rich foods. What are tips for following this plan? Reading food labels Read labels and choose the following: Reduced or low sodium products. Juices with 100% fruit juice. Foods with low saturated fats (<3 g per serving) and high polyunsaturated and monounsaturated fats. Foods with whole grains, such as whole wheat, cracked wheat, brown rice, and wild rice. Whole grains that are fortified with folic acid. This is recommended for females who are pregnant or who want to  become pregnant. Read labels and do not eat or drink the following: Foods or drinks with added sugars. These include foods that contain brown sugar, corn sweetener, corn syrup, dextrose , fructose, glucose, high-fructose corn syrup, honey, invert sugar, lactose, malt syrup, maltose, molasses, raw sugar, sucrose, trehalose, or turbinado sugar. Limit your intake of added sugars to less than 10% of your total daily calories. Do not eat more than the following amounts of added sugar per day: 6 teaspoons (25 g) for females. 9 teaspoons (38 g) for males. Foods that contain processed or refined starches and grains. Refined grain products, such as white flour, degermed cornmeal, white bread, and white rice. Shopping Choose nutrient-rich snacks, such as vegetables, whole fruits, and nuts. Avoid high-calorie and high-sugar snacks, such as potato chips, fruit snacks, and candy. Use oil-based dressings and spreads on foods instead of solid fats such as butter, margarine, sour cream, or cream cheese. Limit pre-made sauces, mixes, and instant products such as flavored rice, instant noodles, and ready-made pasta. Try more plant-protein sources, such as tofu, tempeh, black beans, edamame, lentils, nuts, and seeds. Explore eating plans such as the Mediterranean diet or vegetarian diet. Try heart-healthy dips made with beans and healthy fats like hummus and guacamole. Vegetables go great with these. Cooking Use oil to saut or stir-fry foods instead of solid fats such as butter, margarine, or lard. Try baking, boiling, grilling, or broiling instead of frying. Remove the fatty part of meats before cooking. Steam vegetables in water  or broth. Meal planning  At meals, imagine dividing your plate into fourths: One-half of  your plate is fruits and vegetables. One-fourth of your plate is whole grains. One-fourth of your plate is protein, especially lean meats, poultry, eggs, tofu, beans, or nuts. Include low-fat  dairy as part of your daily diet. Lifestyle Choose healthy options in all settings, including home, work, school, restaurants, or stores. Prepare your food safely: Wash your hands after handling raw meats. Where you prepare food, keep surfaces clean by regularly washing with hot, soapy water . Keep raw meats separate from ready-to-eat foods, such as fruits and vegetables. Cook seafood, meat, poultry, and eggs to the recommended temperature. Get a food thermometer. Store foods at safe temperatures. In general: Keep cold foods at 84F (4.4C) or below. Keep hot foods at 184F (60C) or above. Keep your freezer at [facility] (-17.8C) or below. Foods are not safe to eat if they have been between the temperatures of 40-184F (4.4-60C) for more than 2 hours. What foods should I eat? Fruits Aim to eat 1-2 cups of fresh, canned (in natural juice), or frozen fruits each day. One cup of fruit equals 1 small apple, 1 large banana, 8 large strawberries, 1 cup (237 g) canned fruit,  cup (82 g) dried fruit, or 1 cup (240 mL) 100% juice. Vegetables Aim to eat 2-4 cups of fresh and frozen vegetables each day, including different varieties and colors. One cup of vegetables equals 1 cup (91 g) broccoli or cauliflower florets, 2 medium carrots, 2 cups (150 g) raw, leafy greens, 1 large tomato, 1 large bell pepper, 1 large sweet potato, or 1 medium white potato. Grains Aim to eat 5-10 ounce-equivalents of whole grains each day. Examples of 1 ounce-equivalent of grains include 1 slice of bread, 1 cup (40 g) ready-to-eat cereal, 3 cups (24 g) popcorn, or  cup (93 g) cooked rice. Meats and other proteins Try to eat 5-7 ounce-equivalents of protein each day. Examples of 1 ounce-equivalent of protein include 1 egg,  oz nuts (12 almonds, 24 pistachios, or 7 walnut halves), 1/4 cup (90 g) cooked beans, 6 tablespoons (90 g) hummus or 1 tablespoon (16 g) peanut butter. A cut of meat or fish that is the size of a deck of  cards is about 3-4 ounce-equivalents (85 g). Of the protein you eat each week, try to have at least 8 sounce (227 g) of seafood. This is about 2 servings per week. This includes salmon, trout, herring, sardines, and anchovies. Dairy Aim to eat 3 cup-equivalents of fat-free or low-fat dairy each day. Examples of 1 cup-equivalent of dairy include 1 cup (240 mL) milk, 8 ounces (250 g) yogurt, 1 ounces (44 g) natural cheese, or 1 cup (240 mL) fortified soy milk. Fats and oils Aim for about 5 teaspoons (21 g) of fats and oils per day. Choose monounsaturated fats, such as canola and olive oils, mayonnaise made with olive oil or avocado oil, avocados, peanut butter, and most nuts, or polyunsaturated fats, such as sunflower, corn, and soybean oils, walnuts, pine nuts, sesame seeds, sunflower seeds, and flaxseed. Beverages Aim for 6 eight-ounce glasses of water  per day. Limit coffee to 3-5 eight-ounce cups per day. Limit caffeinated beverages that have added calories, such as soda and energy drinks. If you drink alcohol: Limit how much you have to: 0-1 drink a day if you are female. 0-2 drinks a day if you are female. Know how much alcohol is in your drink. In the [place] one drink is one 12 oz bottle of beer (355 mL), one 5 oz glass of wine (  148 mL), or one 1 oz glass of hard liquor (44 mL). Seasoning and other foods Try not to add too much salt to your food. Try using herbs and spices instead of salt. Try not to add sugar to food. This information is based on [place] nutrition guidelines. To learn more, visit [employer].be. Exact amounts may vary. You may need different amounts. This information is not intended to replace advice given to you by your health care provider. Make sure you discuss any questions you have with your health care provider. Document Revised: 09/26/2021 Document Reviewed: 09/26/2021 Elsevier Patient Education  2024 [employer].

## 2023-09-11 ENCOUNTER — Encounter: Payer: Self-pay | Admitting: Nurse Practitioner

## 2023-09-11 ENCOUNTER — Encounter (INDEPENDENT_AMBULATORY_CARE_PROVIDER_SITE_OTHER): Payer: Self-pay

## 2023-09-11 ENCOUNTER — Ambulatory Visit (INDEPENDENT_AMBULATORY_CARE_PROVIDER_SITE_OTHER): Payer: Self-pay | Admitting: Nurse Practitioner

## 2023-09-11 VITALS — BP 125/80 | HR 80 | Temp 98.4°F | Resp 18 | Ht 62.0 in | Wt 130.4 lb

## 2023-09-11 DIAGNOSIS — E7800 Pure hypercholesterolemia, unspecified: Secondary | ICD-10-CM

## 2023-09-11 DIAGNOSIS — Z789 Other specified health status: Secondary | ICD-10-CM

## 2023-09-11 DIAGNOSIS — R87622 Low grade squamous intraepithelial lesion on cytologic smear of vagina (LGSIL): Secondary | ICD-10-CM

## 2023-09-11 DIAGNOSIS — M357 Hypermobility syndrome: Secondary | ICD-10-CM | POA: Insufficient documentation

## 2023-09-11 DIAGNOSIS — Z0000 Encounter for general adult medical examination without abnormal findings: Secondary | ICD-10-CM

## 2023-09-11 DIAGNOSIS — R42 Dizziness and giddiness: Secondary | ICD-10-CM | POA: Insufficient documentation

## 2023-09-11 LAB — PREGNANCY, URINE: Preg Test, Ur: NEGATIVE

## 2023-09-11 MED ORDER — NORETHINDRONE ACET-ETHINYL EST 1.5-30 MG-MCG PO TABS: 1.0000 | ORAL_TABLET | Freq: Every day | ORAL | 4 refills | Status: AC

## 2023-09-11 NOTE — Assessment & Plan Note (Signed)
 Pap up to date last year, repeat in 2027.

## 2023-09-11 NOTE — Assessment & Plan Note (Signed)
Ongoing, no acute concerns today.  Urine pregnancy testing today.  Check CBC and CMP today.  Return in one year.  Refills sent in.

## 2023-09-11 NOTE — Assessment & Plan Note (Signed)
 Upper extremities noted on exam.  No pain reported.  Continue to monitor and if any pain presents in future could consider assessment for Ehler's Danlos.

## 2023-09-11 NOTE — Progress Notes (Signed)
 BP 125/80 (BP Location: Left Arm)   Pulse 80   Temp 98.4 F (36.9 C) (Oral)   Resp 18   Ht 5' 2 (1.575 m)   Wt 130 lb 6 oz (59.1 kg)   SpO2 98%   BMI 23.85 kg/m    Subjective:    Patient ID: Tammy Shaw, female    DOB: 11-Feb-1995, 28 y.o.   MRN: [id number]  HPI: Tammy Shaw is a 28 y.o. female presenting on 09/11/2023 for comprehensive medical examination. Current medical complaints include:none  She currently lives with: self Menopausal Symptoms: no  DIZZINESS Chronic in nature -- for years.  Current episode 1-2 weeks.  Very light headed.  Passing out episodes every few months, brief period.  Able to sit down and not have injuries. Does notice breathing through mouth a lot and sinus issues. Takes occasional sinus medications at home. In 2014 did see peds cardiology with overall reassuring work-up for syncope. Duration: months Description of symptoms: lightheaded and hearing goes away a little Duration of episode: a few minutes Dizziness frequency: recurrent Provoking factors: unknown Aggravating factors:  unknown Triggered by rolling over in bed: no Triggered by bending over: sometimes Aggravated by head movement: no Aggravated by exertion, coughing, loud noises: no Recent head injury: no Recent or current viral symptoms: no History of vasovagal episodes: no Nausea: yes Vomiting: no Tinnitus: yes Hearing loss: yes Aural fullness: no Headache: no Photophobia/phonophobia: no Unsteady gait: yes Postural instability: no Diplopia, dysarthria, dysphagia or weakness: occasional double vision Related to exertion: no Pallor: no Diaphoresis: no Dyspnea: no Chest pain: no   Depression Screen done today and results listed below:     09/11/2023    1:13 PM 09/08/2022    2:35 PM 07/19/2021    1:32 PM 05/26/2021    9:31 AM 05/16/2021    9:24 AM  Depression screen PHQ 2/9  Decreased Interest 0 0 0 0 0  Down, Depressed, Hopeless 0 0 0 0 0  PHQ - 2 Score 0 0 0 0  0  Altered sleeping 0 0 0 0 0  Tired, decreased energy 0 0 0 0 0  Change in appetite 0 0 0 0 0  Feeling bad or failure about yourself  0 0 0 0 0  Trouble concentrating 0 0 0 0 0  Moving slowly or fidgety/restless 0 0 0 0 0  Suicidal thoughts 0 0 0 0 0  PHQ-9 Score 0 0 0 0 0  Difficult doing work/chores Not difficult at all Not difficult at all Not difficult at all  Not difficult at all      09/11/2023    1:13 PM 09/08/2022    2:35 PM 07/19/2021    1:32 PM 05/26/2021    9:31 AM  GAD 7 : Generalized Anxiety Score  Nervous, Anxious, on Edge 0 0 0 0  Control/stop worrying 0 0 0 0  Worry too much - different things 0 0 0 0  Trouble relaxing 0 0 0 0  Restless 0 0 0 0  Easily annoyed or irritable 0 0 0 0  Afraid - awful might happen 0 0 0 0  Total GAD 7 Score 0 0 0 0  Anxiety Difficulty Not difficult at all Not difficult at all Not difficult at all       05/26/2021    9:31 AM 07/19/2021    1:31 PM 07/19/2021    1:39 PM 09/08/2022    2:35 PM 09/11/2023    1:12  PM  Fall Risk  Falls in the past year? 0 0 0 0 0  Was there an injury with Fall? 0 0 0 0 0  Fall Risk Category Calculator 0 0 0 0 0  Fall Risk Category (Retired) Low  Low  Low     (RETIRED) Patient Fall Risk Level Low fall risk  Low fall risk  Low fall risk     Patient at Risk for Falls Due to No Fall Risks No Fall Risks No Fall Risks No Fall Risks   Fall risk Follow up Falls evaluation completed  Falls evaluation completed  Falls evaluation completed  Falls evaluation completed      Data saved with a previous flowsheet row definition    Past Medical History:  History reviewed. No pertinent past medical history.  Surgical History:  History reviewed. No pertinent surgical history.  Medications:  No current outpatient medications on file prior to visit.   No current facility-administered medications on file prior to visit.    Allergies:  No Known Allergies  Social History:  Social History   Socioeconomic History    Marital status: Single    Spouse name: Not on file   Number of children: Not on file   Years of education: Not on file   Highest education level: Bachelor's degree (e.g., BA, AB, BS)  Occupational History   Not on file  Tobacco Use   Smoking status: Never   Smokeless tobacco: Never  Vaping Use   Vaping status: Never Used  Substance and Sexual Activity   Alcohol use: No   Drug use: No   Sexual activity: Yes  Other Topics Concern   Not on file  Social History Narrative   Not on file   Social Drivers of Health   Financial Resource Strain: Patient Declined (09/10/2023)   Overall Financial Resource Strain (CARDIA)    Difficulty of Paying Living Expenses: Patient declined  Food Insecurity: No Food Insecurity (09/10/2023)   Hunger Vital Sign    Worried About Running Out of Food in the Last Year: Never true    Ran Out of Food in the Last Year: Never true  Transportation Needs: No Transportation Needs (09/10/2023)   PRAPARE - Administrator, Civil Service (Medical): No    Lack of Transportation (Non-Medical): No  Physical Activity: Insufficiently Active (09/10/2023)   Exercise Vital Sign    Days of Exercise per Week: 2 days    Minutes of Exercise per Session: 60 min  Stress: No Stress Concern Present (09/10/2023)   [employer] of Occupational Health - Occupational Stress Questionnaire    Feeling of Stress: Not at all  Social Connections: Moderately Isolated (09/10/2023)   Social Connection and Isolation Panel    Frequency of Communication with Friends and Family: More than three times a week    Frequency of Social Gatherings with Friends and Family: More than three times a week    Attends Religious Services: Never    Database administrator or Organizations: No    Attends Engineer, structural: Not on file    Marital Status: Living with partner  Intimate Partner Violence: Not on file   Social History   Tobacco Use  Smoking Status Never  Smokeless Tobacco  Never   Social History   Substance and Sexual Activity  Alcohol Use No    Family History:  Family History  Problem Relation Age of Onset   Epilepsy Paternal Grandmother     Past medical  history, surgical history, medications, allergies, family history and social history reviewed with patient today and changes made to appropriate areas of the chart.   ROS All other ROS negative except what is listed above and in the HPI.      Objective:    BP 125/80 (BP Location: Left Arm)   Pulse 80   Temp 98.4 F (36.9 C) (Oral)   Resp 18   Ht 5' 2 (1.575 m)   Wt 130 lb 6 oz (59.1 kg)   SpO2 98%   BMI 23.85 kg/m   Wt Readings from Last 3 Encounters:  09/11/23 130 lb 6 oz (59.1 kg)  09/08/22 128 lb 6.4 oz (58.2 kg)  07/19/21 134 lb 9.6 oz (61.1 kg)    Physical Exam Vitals and nursing note reviewed. Exam conducted with a chaperone present.  Constitutional:      General: She is awake. She is not in acute distress.    Appearance: She is well-developed and well-groomed. She is not ill-appearing or toxic-appearing.  HENT:     Head: Normocephalic and atraumatic.     Right Ear: Hearing, ear canal and external ear normal. No drainage. A middle ear effusion is present. There is no impacted cerumen. Tympanic membrane is not injected.     Left Ear: Hearing, ear canal and external ear normal. No drainage. A middle ear effusion is present. There is no impacted cerumen. Tympanic membrane is not injected.     Nose: Nose normal. No rhinorrhea.     Right Sinus: No maxillary sinus tenderness or frontal sinus tenderness.     Left Sinus: No maxillary sinus tenderness or frontal sinus tenderness.     Mouth/Throat:     Mouth: Mucous membranes are moist.     Pharynx: Oropharynx is clear. Uvula midline. No pharyngeal swelling, oropharyngeal exudate or posterior oropharyngeal erythema.  Eyes:     General: Lids are normal.        Right eye: No discharge.        Left eye: No discharge.     Extraocular  Movements: Extraocular movements intact.     Conjunctiva/sclera: Conjunctivae normal.     Pupils: Pupils are equal, round, and reactive to light.     Visual Fields: Right eye visual fields normal and left eye visual fields normal.  Neck:     Thyroid : No thyromegaly.     Vascular: No carotid bruit.     Trachea: Trachea normal.  Cardiovascular:     Rate and Rhythm: Normal rate and regular rhythm.     Heart sounds: Normal heart sounds. No murmur heard.    No gallop.  Pulmonary:     Effort: Pulmonary effort is normal. No accessory muscle usage or respiratory distress.     Breath sounds: Normal breath sounds.  Chest:  Breasts:    Right: Normal.     Left: Normal.  Abdominal:     General: Bowel sounds are normal.     Palpations: Abdomen is soft. There is no hepatomegaly or splenomegaly.     Tenderness: There is no abdominal tenderness.  Musculoskeletal:        General: Normal range of motion.     Cervical back: Normal range of motion and neck supple.     Right lower leg: No edema.     Left lower leg: No edema.  Lymphadenopathy:     Head:     Right side of head: No submental, submandibular, tonsillar, preauricular or posterior auricular adenopathy.  Left side of head: No submental, submandibular, tonsillar, preauricular or posterior auricular adenopathy.     Cervical: No cervical adenopathy.     Upper Body:     Right upper body: No supraclavicular, axillary or pectoral adenopathy.     Left upper body: No supraclavicular, axillary or pectoral adenopathy.  Skin:    General: Skin is warm and dry.     Capillary Refill: Capillary refill takes less than 2 seconds.     Findings: No rash.  Neurological:     Mental Status: She is alert and oriented to person, place, and time.     Cranial Nerves: Cranial nerves 2-12 are intact.     Motor: Motor function is intact.     Coordination: Coordination is intact.     Gait: Gait is intact.     Deep Tendon Reflexes: Reflexes are normal and  symmetric.     Reflex Scores:      Brachioradialis reflexes are 2+ on the right side and 2+ on the left side.      Patellar reflexes are 2+ on the right side and 2+ on the left side.    Comments: Has hypermobility upper extremities noted on exam.  Psychiatric:        Attention and Perception: Attention normal.        Mood and Affect: Mood normal.        Speech: Speech normal.        Behavior: Behavior normal. Behavior is cooperative.        Thought Content: Thought content normal.        Judgment: Judgment normal.    Results for orders placed or performed in visit on 09/08/22  Cytology - PAP   Collection Time: 09/08/22  2:33 PM  Result Value Ref Range   Adequacy      Satisfactory for evaluation; transformation zone component ABSENT.   Diagnosis      - Negative for intraepithelial lesion or malignancy (NILM)   Microorganisms Shift in flora suggestive of bacterial vaginosis   Pregnancy, urine   Collection Time: 09/08/22  3:08 PM  Result Value Ref Range   Preg Test, Ur Negative Negative  Comprehensive metabolic panel   Collection Time: 09/08/22  3:11 PM  Result Value Ref Range   Glucose 91 70 - 99 mg/dL   BUN 12 6 - 20 mg/dL   Creatinine, Ser 9.28 0.57 - 1.00 mg/dL   eGFR 878 >40 fO/fpw/8.26   BUN/Creatinine Ratio 17 9 - 23   Sodium 140 134 - 144 mmol/L   Potassium 3.9 3.5 - 5.2 mmol/L   Chloride 102 96 - 106 mmol/L   CO2 22 20 - 29 mmol/L   Calcium 9.6 8.7 - 10.2 mg/dL   Total Protein 7.6 6.0 - 8.5 g/dL   Albumin 4.6 4.0 - 5.0 g/dL   Globulin, Total 3.0 1.5 - 4.5 g/dL   Bilirubin Total <9.7 0.0 - 1.2 mg/dL   Alkaline Phosphatase 87 44 - 121 IU/L   AST 21 0 - 40 IU/L   ALT 18 0 - 32 IU/L  CBC with Differential/Platelet   Collection Time: 09/08/22  3:11 PM  Result Value Ref Range   WBC 6.3 3.4 - 10.8 x10E3/uL   RBC 4.69 3.77 - 5.28 x10E6/uL   Hemoglobin 13.7 11.1 - 15.9 g/dL   Hematocrit 57.0 65.9 - 46.6 %   MCV 92 79 - 97 fL   MCH 29.2 26.6 - 33.0 pg   MCHC 31.9  31.5 -  35.7 g/dL   RDW 87.7 88.2 - 84.5 %   Platelets 311 150 - 450 x10E3/uL   Neutrophils 64 Not Estab. %   Lymphs 26 Not Estab. %   Monocytes 7 Not Estab. %   Eos 3 Not Estab. %   Basos 0 Not Estab. %   Neutrophils Absolute 4.0 1.4 - 7.0 x10E3/uL   Lymphocytes Absolute 1.6 0.7 - 3.1 x10E3/uL   Monocytes Absolute 0.4 0.1 - 0.9 x10E3/uL   EOS (ABSOLUTE) 0.2 0.0 - 0.4 x10E3/uL   Basophils Absolute 0.0 0.0 - 0.2 x10E3/uL   Immature Granulocytes 0 Not Estab. %   Immature Grans (Abs) 0.0 0.0 - 0.1 x10E3/uL  Lipid Panel w/o Chol/HDL Ratio   Collection Time: 09/08/22  3:11 PM  Result Value Ref Range   Cholesterol, Total 233 (H) 100 - 199 mg/dL   Triglycerides 96 0 - 149 mg/dL   HDL 67 >60 mg/dL   VLDL Cholesterol Cal 17 5 - 40 mg/dL   LDL Chol Calc (NIH) 850 (H) 0 - 99 mg/dL  TSH   Collection Time: 09/08/22  3:11 PM  Result Value Ref Range   TSH 1.280 0.450 - 4.500 uIU/mL      Assessment & Plan:   Problem List Items Addressed This Visit       Musculoskeletal and Integument   Benign hypermobility syndrome   Upper extremities noted on exam.  No pain reported.  Continue to monitor and if any pain presents in future could consider assessment for Ehler's Danlos.        Other   Uses birth control   Ongoing, no acute concerns today.  Urine pregnancy testing today.  Check CBC and CMP today.  Return in one year.  Refills sent in.      Relevant Medications   Norethindrone  Acetate-Ethinyl Estradiol (HAILEY 1.5/30) 1.5-30 MG-MCG tablet   Other Relevant Orders   Pregnancy, urine   LGSIL Pap smear of vagina   Pap up to date last year, repeat in 2027.      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  Labs today.      Relevant Orders   Comprehensive metabolic panel with GFR   Lipid Panel w/o Chol/HDL Ratio   Dizziness   Chronic for years with a reassuring pediatric oncology  work-up in 2014. Overall no red flags on neuro exam today.  Mild effusion to both ears.  Suspect related to sinus and inner ear.  Recommend taking her OTC allergy medication daily and may benefit from nasal spray in future.  Will get into ENT for further assessment.  If overall reassuring ENT work-up may benefit return to cardiology in future and possible Zio as does have some upper body hypermobility, ?POTS.      Relevant Orders   Ambulatory referral to ENT   Other Visit Diagnoses       Encounter for annual physical exam       Annual physical today with labs and health maintenance reviewed, discussed with patient.   Relevant Orders   CBC with Differential/Platelet   TSH        Follow up plan: Return in about 1 year (around 09/10/2024) for Annual Physical.   LABORATORY TESTING:  - Pap smear: Up To Date  IMMUNIZATIONS:   - Tdap: Tetanus vaccination status reviewed: last tetanus booster within 10 years. - Influenza: Up to date - Pneumovax: Not  applicable - Prevnar: Not applicable - COVID: Up to date - HPV: Up to date - Shingrix vaccine: Not applicable  SCREENING: -Mammogram: Not applicable  - Colonoscopy: Not applicable  - Bone Density: Not applicable  -Hearing Test: Not applicable  -Spirometry: Not applicable   PATIENT COUNSELING:   Advised to take 1 mg of folate supplement per day if capable of pregnancy.   Sexuality: Discussed sexually transmitted diseases, partner selection, use of condoms, avoidance of unintended pregnancy  and contraceptive alternatives.   Advised to avoid cigarette smoking.  I discussed with the patient that most people either abstain from alcohol or drink within safe limits (<=14/week and <=4 drinks/occasion for males, <=7/weeks and <= 3 drinks/occasion for females) and that the risk for alcohol disorders and other health effects rises proportionally with the number of drinks per week and how often a drinker exceeds daily limits.  Discussed  cessation/primary prevention of drug use and availability of treatment for abuse.   Diet: Encouraged to adjust caloric intake to maintain  or achieve ideal body weight, to reduce intake of dietary saturated fat and total fat, to limit sodium intake by avoiding high sodium foods and not adding table salt, and to maintain adequate dietary potassium and calcium preferably from fresh fruits, vegetables, and low-fat dairy products.    Stressed the importance of regular exercise  Injury prevention: Discussed safety belts, safety helmets, smoke detector, smoking near bedding or upholstery.   Dental health: Discussed importance of regular tooth brushing, flossing, and dental visits.    NEXT PREVENTATIVE PHYSICAL DUE IN 1 YEAR. Return in about 1 year (around 09/10/2024) for Annual Physical.

## 2023-09-11 NOTE — Assessment & Plan Note (Signed)
 Chronic for years with a reassuring pediatric oncology work-up in 2014. Overall no red flags on neuro exam today.  Mild effusion to both ears.  Suspect related to sinus and inner ear.  Recommend taking her OTC allergy medication daily and may benefit from nasal spray in future.  Will get into ENT for further assessment.  If overall reassuring ENT work-up may benefit return to cardiology in future and possible Zio as does have some upper body hypermobility, ?POTS.

## 2023-09-11 NOTE — Assessment & Plan Note (Signed)
Noted on past labs, recommend heavy focus on diet changes and regular exercise.  Labs today.

## 2023-09-12 ENCOUNTER — Ambulatory Visit: Payer: Self-pay | Admitting: Nurse Practitioner

## 2023-09-12 LAB — CBC WITH DIFFERENTIAL/PLATELET
Basophils Absolute: 0 x10E3/uL (ref 0.0–0.2)
Basos: 1 %
EOS (ABSOLUTE): 0.2 x10E3/uL (ref 0.0–0.4)
Eos: 3 %
Hematocrit: 42.6 % (ref 34.0–46.6)
Hemoglobin: 13.6 g/dL (ref 11.1–15.9)
Immature Grans (Abs): 0 x10E3/uL (ref 0.0–0.1)
Immature Granulocytes: 0 %
Lymphocytes Absolute: 1.8 x10E3/uL (ref 0.7–3.1)
Lymphs: 28 %
MCH: 29.7 pg (ref 26.6–33.0)
MCHC: 31.9 g/dL (ref 31.5–35.7)
MCV: 93 fL (ref 79–97)
Monocytes Absolute: 0.5 x10E3/uL (ref 0.1–0.9)
Monocytes: 8 %
Neutrophils Absolute: 4.1 x10E3/uL (ref 1.4–7.0)
Neutrophils: 60 %
Platelets: 297 x10E3/uL (ref 150–450)
RBC: 4.58 x10E6/uL (ref 3.77–5.28)
RDW: 12.2 % (ref 11.7–15.4)
WBC: 6.7 x10E3/uL (ref 3.4–10.8)

## 2023-09-12 LAB — COMPREHENSIVE METABOLIC PANEL WITH GFR
ALT: 11 IU/L (ref 0–32)
AST: 11 IU/L (ref 0–40)
Albumin: 4.7 g/dL (ref 4.0–5.0)
Alkaline Phosphatase: 66 IU/L (ref 44–121)
BUN/Creatinine Ratio: 14 (ref 9–23)
BUN: 11 mg/dL (ref 6–20)
Bilirubin Total: 0.3 mg/dL (ref 0.0–1.2)
CO2: 23 mmol/L (ref 20–29)
Calcium: 9.8 mg/dL (ref 8.7–10.2)
Chloride: 102 mmol/L (ref 96–106)
Creatinine, Ser: 0.79 mg/dL (ref 0.57–1.00)
Globulin, Total: 2.8 g/dL (ref 1.5–4.5)
Glucose: 92 mg/dL (ref 70–99)
Potassium: 4 mmol/L (ref 3.5–5.2)
Sodium: 139 mmol/L (ref 134–144)
Total Protein: 7.5 g/dL (ref 6.0–8.5)
eGFR: 106 mL/min/1.73 (ref >59)

## 2023-09-12 LAB — LIPID PANEL W/O CHOL/HDL RATIO
Cholesterol, Total: 208 mg/dL — ABNORMAL HIGH (ref 100–199)
HDL: 58 mg/dL (ref >39)
LDL Chol Calc (NIH): 136 mg/dL — ABNORMAL HIGH (ref 0–99)
Triglycerides: 80 mg/dL (ref 0–149)
VLDL Cholesterol Cal: 14 mg/dL (ref 5–40)

## 2023-09-12 LAB — TSH: TSH: 1.64 u[IU]/mL (ref 0.450–4.500)

## 2023-09-12 NOTE — Progress Notes (Signed)
 Contacted via MyChart  Good day Tammy Shaw, your labs have returned and are overall stable with exception of lipid panel.  Continue focus on health diet and regular exercise. Any questions? Keep being stellar!!  Thank you for allowing me to participate in your care.  I appreciate you. Kindest regards, Corey Laski

## 2023-11-26 ENCOUNTER — Other Ambulatory Visit (INDEPENDENT_AMBULATORY_CARE_PROVIDER_SITE_OTHER): Payer: Self-pay

## 2023-11-26 DIAGNOSIS — H9190 Unspecified hearing loss, unspecified ear: Secondary | ICD-10-CM

## 2023-11-27 ENCOUNTER — Ambulatory Visit (INDEPENDENT_AMBULATORY_CARE_PROVIDER_SITE_OTHER): Admitting: Audiology

## 2023-11-27 ENCOUNTER — Ambulatory Visit (INDEPENDENT_AMBULATORY_CARE_PROVIDER_SITE_OTHER)

## 2023-11-27 ENCOUNTER — Encounter (INDEPENDENT_AMBULATORY_CARE_PROVIDER_SITE_OTHER): Payer: Self-pay

## 2023-11-27 VITALS — BP 108/70 | HR 70 | Temp 98.0°F | Wt 127.0 lb

## 2023-11-27 DIAGNOSIS — Z0110 Encounter for examination of ears and hearing without abnormal findings: Secondary | ICD-10-CM

## 2023-11-27 DIAGNOSIS — J302 Other seasonal allergic rhinitis: Secondary | ICD-10-CM

## 2023-11-27 DIAGNOSIS — J3489 Other specified disorders of nose and nasal sinuses: Secondary | ICD-10-CM | POA: Diagnosis not present

## 2023-11-27 DIAGNOSIS — J343 Hypertrophy of nasal turbinates: Secondary | ICD-10-CM | POA: Diagnosis not present

## 2023-11-27 DIAGNOSIS — J342 Deviated nasal septum: Secondary | ICD-10-CM

## 2023-11-27 DIAGNOSIS — R42 Dizziness and giddiness: Secondary | ICD-10-CM

## 2023-11-27 DIAGNOSIS — H93293 Other abnormal auditory perceptions, bilateral: Secondary | ICD-10-CM

## 2023-11-27 MED ORDER — AZELASTINE HCL 0.1 % NA SOLN: 1.0000 | Freq: Two times a day (BID) | NASAL | 12 refills | Status: AC

## 2023-11-27 MED ORDER — FLUTICASONE PROPIONATE 50 MCG/ACT NA SUSP: 2.0000 | Freq: Every day | NASAL | 6 refills | Status: AC

## 2023-11-27 NOTE — Progress Notes (Signed)
 Dear Dr. Valerio, Here is my assessment for our mutual patient, Tammy Shaw. Thank you for allowing me the opportunity to care for your patient. Please do not hesitate to contact me should you have any other questions. Sincerely, Dr. Penne Croak  Otolaryngology Clinic Note Referring provider: Dr. Valerio HPI:  Discussed the use of AI scribe software for clinical note transcription with the patient, who gave verbal consent to proceed.  History of Present Illness Tammy Shaw is a 28 year old female who presents with episodes of dizziness and ear ringing.  Episodic dizziness and tinnitus - Episodes of lightheadedness accompanied by tinnitus and temporary hearing loss - Episodes sometimes associated with visual changes and a sensation of impending syncope - Duration of episodes typically seconds, occasionally extending to a few minutes - Frequency approximately once every couple of weeks, with more severe episodes every few months - Dizziness described as lightheadedness rather than vertigo - Episodes often triggered by positional changes, such as standing up after sitting - No frequent headaches or vertigo-like symptoms - No chest pain, shortness of breath, or palpitations  Nasal obstruction and allergic symptoms - History of deviated septum due to childhood incident with epistaxis - Chronic nasal congestion and primarily breathes through one side of the nose - No use of intranasal corticosteroids such as Flonase  - Uses generic allergy pills for seasonal allergies  Independent Review of Additional Tests or Records:  Reviewed external note from referring PCP, Cannady,describing relevant history incorporated into today's evaluation. I personally reviewed and interpreted audiogram. Hearing WNL AU. Word discrim 100% Au. SRT 0 AU. Type A tymp AU    PMH/Meds/All/SocHx/FamHx/ROS:  History reviewed. No pertinent past medical history.   History reviewed. No pertinent  surgical history.  Family History  Problem Relation Age of Onset   Epilepsy Paternal Grandmother      Social Connections: Moderately Isolated (09/10/2023)   Social Connection and Isolation Panel    Frequency of Communication with Friends and Family: More than three times a week    Frequency of Social Gatherings with Friends and Family: More than three times a week    Attends Religious Services: Never    Database Administrator or Organizations: No    Attends Engineer, Structural: Not on file    Marital Status: Living with partner      Current Outpatient Medications:    azelastine (ASTELIN) 0.1 % nasal spray, Place 1 spray into both nostrils 2 (two) times daily. Use in each nostril as directed, Disp: 30 mL, Rfl: 12   fluticasone  (FLONASE ) 50 MCG/ACT nasal spray, Place 2 sprays into both nostrils daily., Disp: 16 g, Rfl: 6   Norethindrone  Acetate-Ethinyl Estradiol (HAILEY 1.5/30) 1.5-30 MG-MCG tablet, Take 1 tablet by mouth daily., Disp: 84 tablet, Rfl: 4   Physical Exam:   BP 108/70 (BP Location: Left Arm, Patient Position: Sitting, Cuff Size: Normal)   Pulse 70   Temp 98 F (36.7 C)   Wt 127 lb (57.6 kg)   SpO2 98%   BMI 23.23 kg/m   The patient was awake, alert, and appropriate. The external ears were inspected, and otoscopy was performed to evaluate the external auditory canals and tympanic membranes. The nasal cavity and septum were examined for mucosal changes, obstruction, or discharge. The oral cavity and oropharynx were inspected for mucosal lesions, infection, or tonsillar hypertrophy. The neck was palpated for lymphadenopathy, thyroid  abnormalities, or other masses. Cranial nerve function was grossly intact.  Pertinent Findings: Physical Exam VITALS:  BP- 108/70 HEENT: Ears normal on otoscopic exam. Severe dns to the left with inferior turbinate hypertrophy. Tms clear. Normal landmarks. Neck with FROM no lymphadenopathy. No facial weakness.   Seprately  Identifiable Procedures:  I personally ordered, reviewed and interpreted the following with the patient today  Given the patient's symptoms and incomplete visualization of critical sinonasal areas with anterior rhinoscopy, a separately performed diagnostic nasal endoscopy procedure is indicated for a complete rhinologic evaluation per American Rhinologic Society recommendations (https://www.american-rhinologic.org/position-statements)  I personally ordered, reviewed and interpreted the following with the patient today  Procedure Note Diagnostic Nasal Endoscopy CPT CODE -- 68768 - Mod 25  Prior to initiating any procedures, risks/benefits/alternatives were explained to the patient and verbal consent obtained.  Pre-procedure diagnosis: Concern for obstruction Post-procedure diagnosis: same Indication: See pre-procedure diagnosis and physical exam above Complications: None apparent EBL: 0 mL Anesthesia: Lidocaine 4% and topical decongestant was topically sprayed in each nasal cavity  Description of Procedure:  Patient was identified. A flexible fiberoptic endoscope was utilized to evaluate the sinonasal cavities, mucosa, sinus ostia and turbinates and septum.  Overall, signs of mucosal inflammation are noted.  Also noted are significant dns.  No mucopurulence, polyps, or masses noted.   Right Middle meatus: clear Right SE Recess: clear Left MM: clear Left SE Recess: clear Photodocumentation was obtained.   Impression & Plans:  Tammy Shaw is a 28 y.o. female  1. DNS (deviated nasal septum)   2. Hypertrophy of inferior nasal turbinate   3. Seasonal allergies    - Findings and diagnoses discussed in detail with the patient. - Risks, benefits, and alternatives were reviewed. Through shared decision making, the patient elects to proceed with below. Assessment & Plan Deviated nasal septum with nasal obstruction Septum deviated to the left, narrowing right nasal passage,  contributing to obstruction and sneezing. - Prescribed Flonase  nasal spray, one spray each nostril daily, increase to two sprays twice daily if needed. - Discussed potential surgical correction if medical management insufficient.  Seasonal allergic rhinitis Contributing to sneezing and nasal obstruction, managed with antihistamines. - Continue over-the-counter antihistamines as needed. - Discussed allergy testing to identify specific allergens.  Lightheadedness and episodic dizziness Intermittent symptoms likely due to orthostatic hypotension, with low blood pressure noted. - Increase water and salt intake. - Consider CT scan for superior semicircular canal dehiscence if symptoms persist. - Recommended orthostatic vital signs evaluation with primary care provider.  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 - Discussed CT scan option to confirm diagnosis, deferred for now.  - Orders placed:  Orders Placed This Encounter  Procedures   Ambulatory referral to Allergy   - Medications prescribed/continued/adjusted:  Meds ordered this encounter  Medications   azelastine (ASTELIN) 0.1 % nasal spray    Sig: Place 1 spray into both nostrils 2 (two) times daily. Use in each nostril as directed    Dispense:  30 mL    Refill:  12   fluticasone  (FLONASE ) 50 MCG/ACT nasal spray    Sig: Place 2 sprays into both nostrils daily.    Dispense:  16 g    Refill:  6   - Education materials provided to the patient. - Follow up: 8 weeks, discuss septoplasty and turb reduction. Patient instructed to return sooner or go to the ED if new/worsening symptoms develop.   Thank you for allowing me the opportunity to care for your patient. Please do not hesitate to contact me  should you have any other questions.  Sincerely, Penne Croak, DO Otolaryngologist (ENT) [facility] Phone:  [phone] Fax: [phone]  11/27/2023, 4:51 PM

## 2023-11-27 NOTE — Progress Notes (Unsigned)
  [address] [place], [place] [address] [phone]  Audiological Evaluation    Name: Tammy Shaw     DOB:   Sep 25, 1995      MRN:   [id number]                                                                                     Service Date: 11/27/2023     Accompanied by: unaccompanied   Patient comes today after Dr. Anice, ENT sent a referral for a hearing evaluation due to concerns with dizziness.   Symptoms Yes Details  Hearing loss  []    Tinnitus  [x]  Both ears  Ear pain/ infections/pressure  []    Balance problems  [x]  Reports dizziness for seconds    Noise exposure history  []    Previous ear surgeries  []    Family history of hearing loss  []    Amplification  []    Other  []      Otoscopy: Right ear: Clear external ear canal and notable landmarks visualized on the tympanic membrane. Left ear:  Clear external ear canal and notable landmarks visualized on the tympanic membrane.  Tympanometry: Right ear: Normal external ear canal volume with normal middle ear pressure and tympanic membrane compliance (Type A). Findings are suggestive of normal middle ear function. Left ear: Normal external ear canal volume with normal middle ear pressure and tympanic membrane compliance (Type A). Findings are suggestive of normal middle ear function.  Hearing Evaluation The hearing test results were completed under headphones and results are deemed to be of good reliability. Test technique:  conventional    Pure tone Audiometry: Right ear- Hearing within the normal limits [phone]  Left ear-  Hearing within the normal limits [phone]   Speech Audiometry: Right ear- Speech Reception Threshold (SRT) was obtained at 0 dBHL. Left ear-Speech Reception Threshold (SRT) was obtained at 0 dBHL.   Word Recognition Score Tested using NU-6 (recorded) Right ear: 100% was obtained at a presentation level of 50 dBHL with contralateral masking which is deemed as   excellent. Left ear: 100% was obtained at a presentation level of 50 dBHL with contralateral masking which is deemed as  excellent.   Impression: There is not a significant difference in pure-tone thresholds between ears., There is not a significant difference in the word recognition score in between ears.    Recommendations: Return for a hearing evaluation if concerns with hearing changes arise or per MD recommendation.   Tammy Shaw, AUD

## 2023-12-12 ENCOUNTER — Encounter: Payer: Self-pay | Admitting: Audiology

## 2024-01-15 ENCOUNTER — Ambulatory Visit: Admitting: Allergy & Immunology

## 2024-01-22 ENCOUNTER — Encounter (INDEPENDENT_AMBULATORY_CARE_PROVIDER_SITE_OTHER): Payer: Self-pay

## 2024-01-22 ENCOUNTER — Ambulatory Visit (INDEPENDENT_AMBULATORY_CARE_PROVIDER_SITE_OTHER)

## 2024-01-22 VITALS — BP 110/72 | HR 69 | Temp 98.0°F | Wt 127.0 lb

## 2024-01-22 DIAGNOSIS — J3489 Other specified disorders of nose and nasal sinuses: Secondary | ICD-10-CM

## 2024-01-22 DIAGNOSIS — R0981 Nasal congestion: Secondary | ICD-10-CM

## 2024-01-22 DIAGNOSIS — J342 Deviated nasal septum: Secondary | ICD-10-CM

## 2024-01-22 DIAGNOSIS — J343 Hypertrophy of nasal turbinates: Secondary | ICD-10-CM

## 2024-01-22 DIAGNOSIS — J302 Other seasonal allergic rhinitis: Secondary | ICD-10-CM

## 2024-01-22 NOTE — Progress Notes (Signed)
 Dear Dr. Valerio, Here is my assessment for our mutual patient, Tammy Shaw. Thank you for allowing me the opportunity to care for your patient. Please do not hesitate to contact me should you have any other questions. Sincerely, Dr. Penne Croak  Otolaryngology Clinic Note Referring provider: Dr. Valerio HPI:  Discussed the use of AI scribe software for clinical note transcription with the patient, who gave verbal consent to proceed.  History of Present Illness Tammy Shaw is a 29 year old female who presents with episodes of dizziness and ear ringing.  Episodic dizziness and tinnitus - Episodes of lightheadedness accompanied by tinnitus and temporary hearing loss - Episodes sometimes associated with visual changes and a sensation of impending syncope - Duration of episodes typically seconds, occasionally extending to a few minutes - Frequency approximately once every couple of weeks, with more severe episodes every few months - Dizziness described as lightheadedness rather than vertigo - Episodes often triggered by positional changes, such as standing up after sitting - No frequent headaches or vertigo-like symptoms - No chest pain, shortness of breath, or palpitations  Nasal obstruction and allergic symptoms - History of deviated septum due to childhood incident with epistaxis - Chronic nasal congestion and primarily breathes through one side of the nose - No use of intranasal corticosteroids such as Flonase  - Uses generic allergy pills for seasonal allergies   Tammy Shaw is a 29 year old female with chronic nasal obstruction due to a deviated septum and turbinate hypertrophy who presents for evaluation of persistent nasal airway compromise.  Nasal Obstruction and Airway Compromise: - Chronic nasal obstruction, predominantly on the left side - Persistent difficulty breathing through the nose - Nocturnal mouth breathing with frequent awakening due to  xerostomia - Symptoms exacerbated in the spring - No current purulent nasal drainage or signs of acute infection - Occasional epistaxis, attributed to nasal spray use  Prior Treatments and Response: - Daily use of intranasal corticosteroids and an additional nasal spray with only minimal and transient relief - No formal allergy testing or immunotherapy to date - Referral to allergist provided but not yet completed  Relevant History and Patient Goals: - Single episode of nasal trauma in childhood - Expresses interest in surgical intervention to improve nasal airflow - Inquires about concurrent correction of dorsal nasal hump for cosmetic purposes  Independent Review of Additional Tests or Records:  Reviewed external note from referring PCP, Cannady,describing relevant history incorporated into todays evaluation.    Hearing WNL AU. Word discrim 100% Au. SRT 0 AU. Type A tymp AU    PMH/Meds/All/SocHx/FamHx/ROS:  History reviewed. No pertinent past medical history.   History reviewed. No pertinent surgical history.  Family History  Problem Relation Age of Onset   Epilepsy Paternal Grandmother      Social Connections: Moderately Isolated (09/10/2023)   Social Connection and Isolation Panel    Frequency of Communication with Friends and Family: More than three times a week    Frequency of Social Gatherings with Friends and Family: More than three times a week    Attends Religious Services: Never    Database Administrator or Organizations: No    Attends Engineer, Structural: Not on file    Marital Status: Living with partner      Current Outpatient Medications:    azelastine  (ASTELIN ) 0.1 % nasal spray, Place 1 spray into both nostrils 2 (two) times daily. Use in each nostril as directed, Disp: 30 mL, Rfl: 12  fluticasone  (FLONASE ) 50 MCG/ACT nasal spray, Place 2 sprays into both nostrils daily., Disp: 16 g, Rfl: 6   Norethindrone  Acetate-Ethinyl Estradiol (HAILEY  1.5/30) 1.5-30 MG-MCG tablet, Take 1 tablet by mouth daily., Disp: 84 tablet, Rfl: 4   Physical Exam:   BP 110/72 (BP Location: Right Arm, Patient Position: Sitting, Cuff Size: Normal)   Pulse 69   Temp 98 F (36.7 C)   Wt 127 lb (57.6 kg)   SpO2 98%   BMI 23.23 kg/m   The patient was awake, alert, and appropriate. The external ears were inspected, and otoscopy was performed to evaluate the external auditory canals and tympanic membranes. The nasal cavity and septum were examined for mucosal changes, obstruction, or discharge. The oral cavity and oropharynx were inspected for mucosal lesions, infection, or tonsillar hypertrophy. The neck was palpated for lymphadenopathy, thyroid  abnormalities, or other masses. Cranial nerve function was grossly intact.  Pertinent Findings: Physical Exam VITALS: BP- 108/70 HEENT: Ears normal on otoscopic exam. Severe dns to the left with inferior turbinate hypertrophy. Tms clear. Normal landmarks. Neck with FROM no lymphadenopathy. No facial weakness.  HEENT: Atraumatic, normocephalic. External ears normal. Tympanic membrane with normal landmarks. Nasal septum deviated to the left. Tonsils normal. Prominent dorsal hump. Bulbous nasal tip. Mild mid vault deflection to the right.              Seprately Identifiable Procedures:  I personally ordered, reviewed and interpreted the following with the patient today  Given the patient's symptoms and incomplete visualization of critical sinonasal areas with anterior rhinoscopy, a separately performed diagnostic nasal endoscopy procedure is indicated for a complete rhinologic evaluation per American Rhinologic Society recommendations (https://www.american-rhinologic.org/position-statements)  I personally ordered, reviewed and interpreted the following with the patient today  Procedure Note Diagnostic Nasal Endoscopy CPT CODE -- 68768 - Mod 25  Prior to initiating any procedures,  risks/benefits/alternatives were explained to the patient and verbal consent obtained.  Pre-procedure diagnosis: Concern for obstruction Post-procedure diagnosis: same Indication: See pre-procedure diagnosis and physical exam above Complications: None apparent EBL: 0 mL Anesthesia: Lidocaine 4% and topical decongestant was topically sprayed in each nasal cavity  Description of Procedure:  Patient was identified. A flexible fiberoptic endoscope was utilized to evaluate the sinonasal cavities, mucosa, sinus ostia and turbinates and septum.  Overall, signs of mucosal inflammation are noted.  Also noted are significant dns.  No mucopurulence, polyps, or masses noted.   Right Middle meatus: clear Right SE Recess: clear Left MM: clear Left SE Recess: clear Photodocumentation was obtained.  Impression & Plans:  Tammy Shaw is a 29 y.o. female  1. DNS (deviated nasal septum)   2. Hypertrophy of inferior nasal turbinate   3. Seasonal allergies   4. Nasal congestion    - Findings and diagnoses discussed in detail with the patient. - Risks, benefits, and alternatives were reviewed. Through shared decision making, the patient elects to proceed with below. Assessment & Plan Deviated nasal septum with nasal obstruction Severe exacerbation of nasal obstruction due to a markedly deviated septum. Minimal relief from intranasal corticosteroid and antihistamine sprays. Surgical intervention indicated for functional improvement. Septoplasty expected to improve airflow and reduce mouth breathing. Risks include pain, nasal crusting, minor epistaxis, and need for internal nasal splints postoperatively. - Discussed septoplasty to improve nasal airflow. - Explained outpatient procedure under general anesthesia with endotracheal intubation. - Described use of internal nasal splints for one week postoperatively. - Reviewed postoperative course: initial lack of improvement until splint removal, possible  pain, nasal crusting, and minor epistaxis. - Advised discontinuation of nasal sprays one week prior to surgery and for one month postoperatively. - Arranged for office staff to contact her regarding scheduling and insurance coverage.  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 Medical therapy inadequate. Surgical reduction indicated with septoplasty for optimal airflow improvement. - Explained internal turbinate reduction as part of the procedure. - Reviewed postoperative care, including saline spray to maintain nasal moisture and minimize crusting.  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 After evaluation and discussion, surgical correction via an endonasal (closed) rhinoplasty approach was recommended as an appropriate treatment option given the isolated nature of the deformity and adequate nasal support. The medical decision-making involved elective surgical intervention with associated risk, including but not limited to bleeding, infection, asymmetry, residual or recurrent dorsal irregularity, contour deformity, nasal obstruction, scarring, dissatisfaction with cosmetic outcome, and potential need for revision surgery. Alternatives, including observation, non-surgical camouflage, or an open rhinoplasty approach, were discussed. Goal is solely reduce nasal hump. No tip work to be done. She will have a cast and nasal splints after surgery. The patient demonstrated understanding of the risks, benefits, and alternatives and will schedule if she elects to proceed.   - Orders placed:  Orders Placed This Encounter  Procedures   Ambulatory Referral For Surgery Scheduling   - Medications prescribed/continued/adjusted:  No orders of the defined types were placed in this encounter.  - Education  materials provided to the patient. - Follow up: 8 weeks, discuss septoplasty and turb reduction. Patient instructed to return sooner or go to the ED if new/worsening symptoms develop.   Thank you for allowing me the opportunity to care for your patient. Please do not hesitate to contact me should you have any other questions.  Sincerely, Penne Croak, DO Otolaryngologist (ENT) [facility] Phone: [phone] Fax: [phone]  01/22/2024, 10:40 PM

## 2024-03-05 ENCOUNTER — Ambulatory Visit: Admitting: Allergy

## 2024-03-31 ENCOUNTER — Encounter (HOSPITAL_BASED_OUTPATIENT_CLINIC_OR_DEPARTMENT_OTHER): Payer: Self-pay

## 2024-03-31 ENCOUNTER — Ambulatory Visit (HOSPITAL_BASED_OUTPATIENT_CLINIC_OR_DEPARTMENT_OTHER): Admit: 2024-03-31

## 2024-03-31 SURGERY — SEPTOPLASTY, NOSE, WITH NASAL TURBINATE REDUCTION
Anesthesia: General | Laterality: Bilateral

## 2024-04-07 ENCOUNTER — Encounter (INDEPENDENT_AMBULATORY_CARE_PROVIDER_SITE_OTHER)

## 2024-09-16 ENCOUNTER — Encounter: Admitting: Nurse Practitioner
